# Patient Record
Sex: Male | Born: 1955 | Race: White | Hispanic: No | Smoking: Heavy tobacco smoker
Health system: Southern US, Community
[De-identification: ages and names within clinical notes are randomized; demographics above are authoritative.]

## PROBLEM LIST (undated history)

## (undated) DIAGNOSIS — G4730 Sleep apnea, unspecified: Secondary | ICD-10-CM

## (undated) DIAGNOSIS — E785 Hyperlipidemia, unspecified: Secondary | ICD-10-CM

## (undated) DIAGNOSIS — F32A Depression, unspecified: Secondary | ICD-10-CM

## (undated) DIAGNOSIS — I10 Essential (primary) hypertension: Secondary | ICD-10-CM

## (undated) DIAGNOSIS — F329 Major depressive disorder, single episode, unspecified: Secondary | ICD-10-CM

## (undated) DIAGNOSIS — J449 Chronic obstructive pulmonary disease, unspecified: Secondary | ICD-10-CM

## (undated) HISTORY — DX: Major depressive disorder, single episode, unspecified: F32.9

## (undated) HISTORY — DX: Sleep apnea, unspecified: G47.30

## (undated) HISTORY — DX: Essential (primary) hypertension: I10

## (undated) HISTORY — DX: Depression, unspecified: F32.A

## (undated) HISTORY — PX: OTHER SURGICAL HISTORY: SHX169

## (undated) HISTORY — PX: APPENDECTOMY: SHX54

## (undated) HISTORY — DX: Hyperlipidemia, unspecified: E78.5

## (undated) HISTORY — DX: Chronic obstructive pulmonary disease, unspecified: J44.9

---

## 2008-04-25 ENCOUNTER — Emergency Department (HOSPITAL_COMMUNITY): Admission: EM | Admit: 2008-04-25 | Discharge: 2008-04-25 | Payer: Self-pay | Admitting: Emergency Medicine

## 2010-04-17 ENCOUNTER — Emergency Department (HOSPITAL_COMMUNITY): Admission: EM | Admit: 2010-04-17 | Discharge: 2010-04-17 | Payer: Self-pay | Admitting: Family Medicine

## 2011-04-30 ENCOUNTER — Inpatient Hospital Stay (INDEPENDENT_AMBULATORY_CARE_PROVIDER_SITE_OTHER)
Admission: RE | Admit: 2011-04-30 | Discharge: 2011-04-30 | Disposition: A | Discharge status: Home / Self Care | Payer: BC Managed Care – PPO | Source: Ambulatory Visit | Attending: Emergency Medicine | Admitting: Emergency Medicine

## 2011-04-30 DIAGNOSIS — B353 Tinea pedis: Secondary | ICD-10-CM

## 2011-04-30 DIAGNOSIS — T7840XA Allergy, unspecified, initial encounter: Secondary | ICD-10-CM

## 2011-05-25 IMAGING — CR DG CHEST 2V
2 series · 2 of 2 positions shown · non-contrast
Comparison: None.

CLINICAL DATA: Cough

CHEST - 2 VIEW

[view not recorded (1 of 2)]
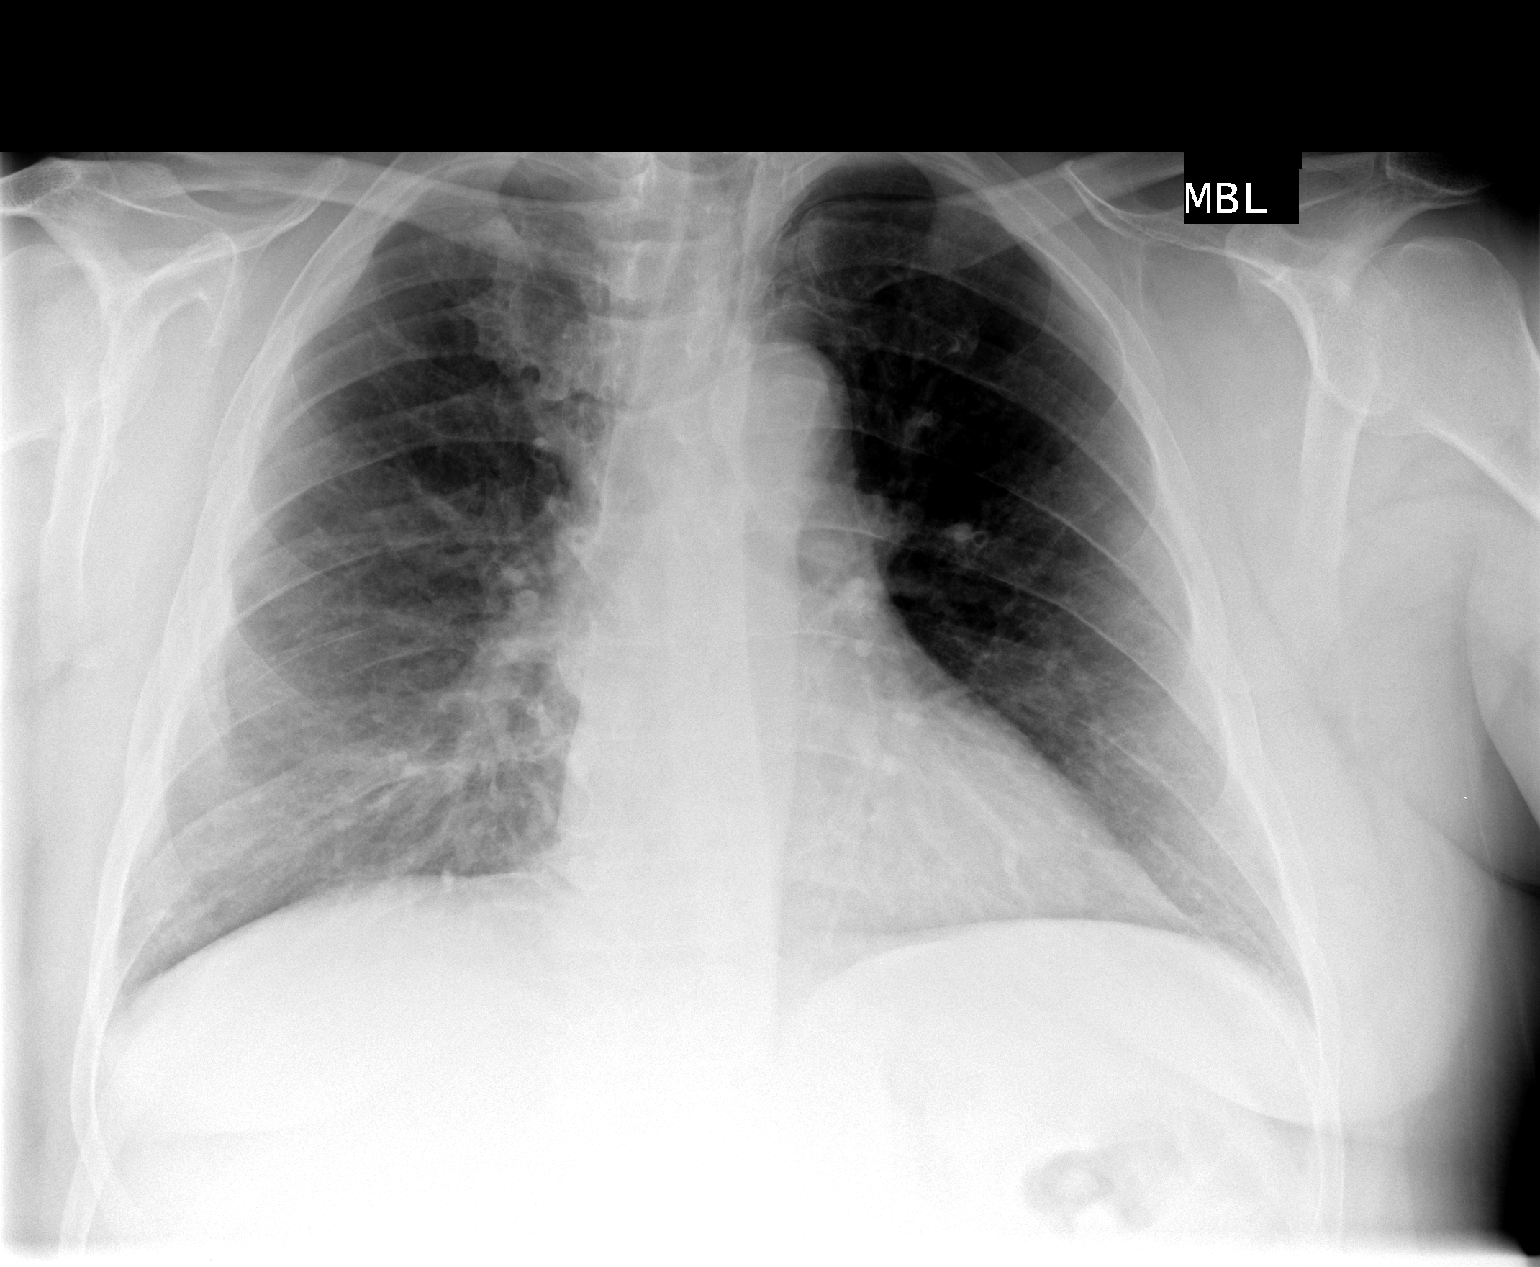

[view not recorded (2 of 2)]
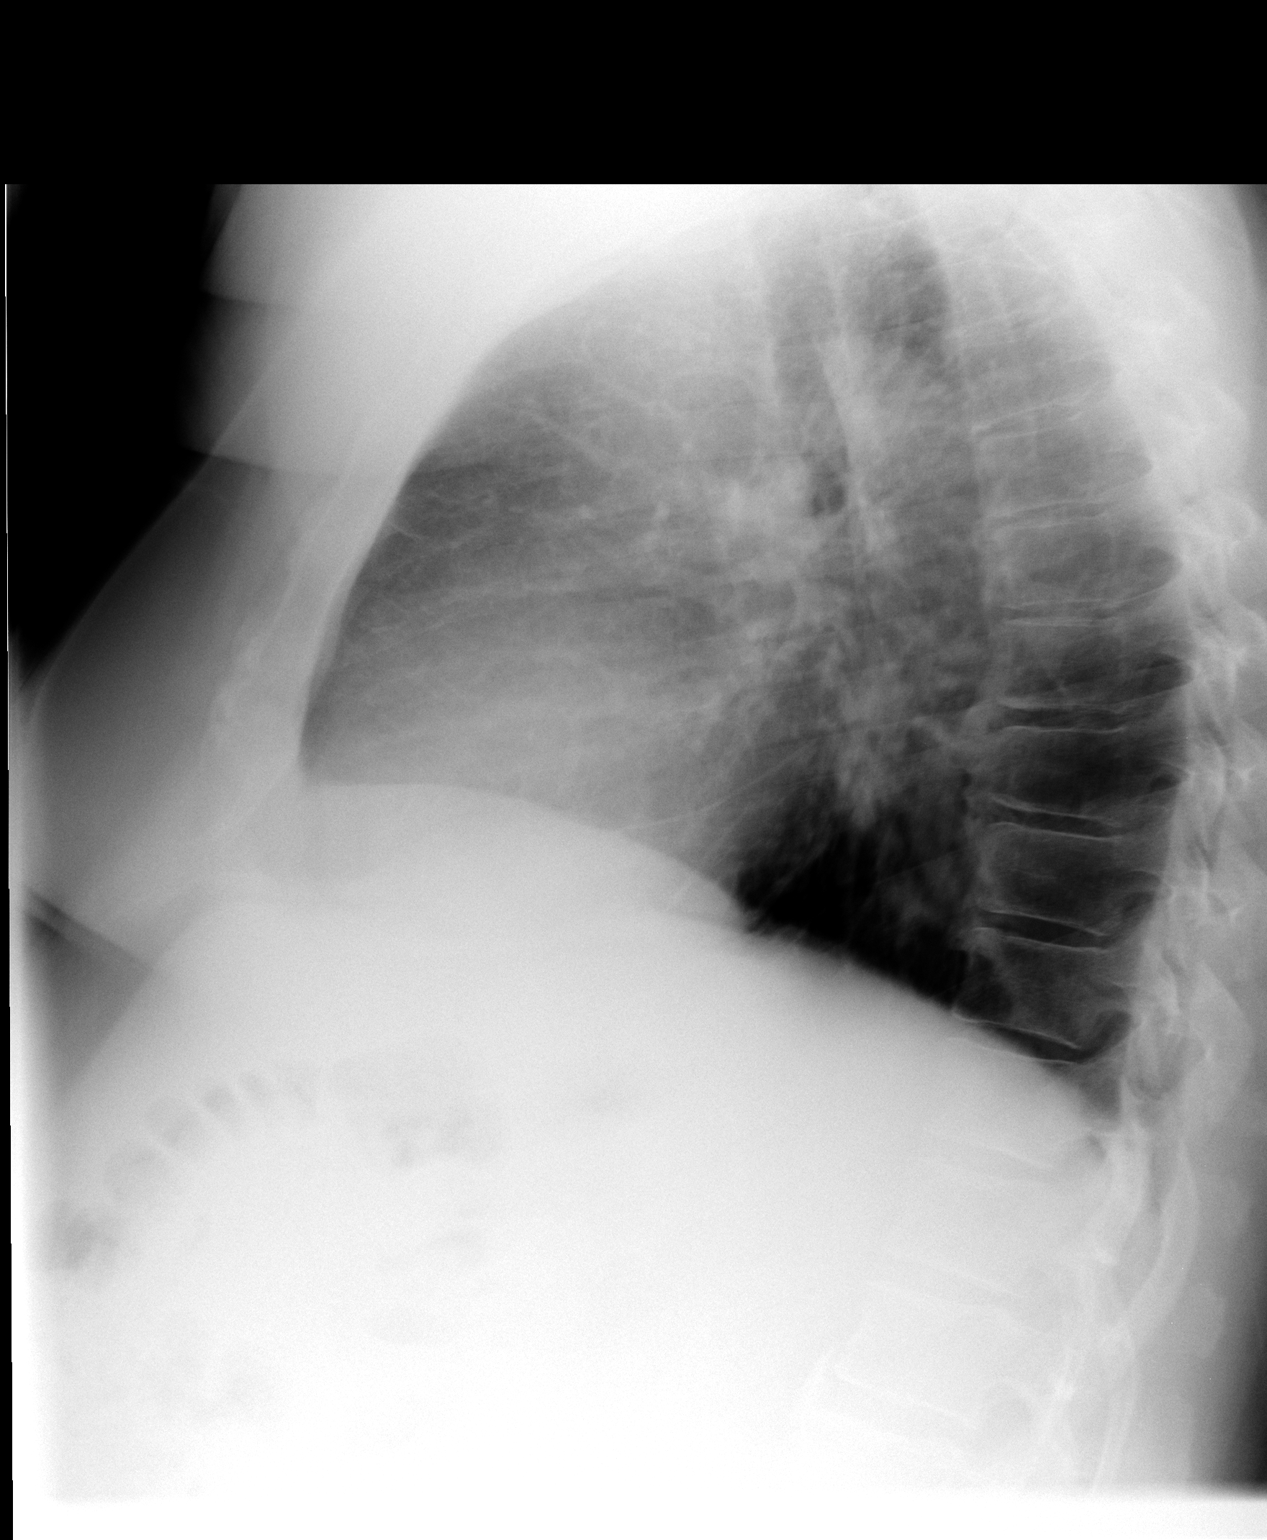

[2 of 2 positions shown; findings below may reference images not displayed]

FINDINGS: Cardiomediastinal silhouette is unremarkable.  No acute
infiltrate or pleural effusion.  No pulmonary edema.  Mild
degenerative changes are noted mid and lower thoracic spine.
IMPRESSION: No acute infiltrate or edema.  Mild degenerative changes thoracic
spine.

## 2014-02-04 ENCOUNTER — Ambulatory Visit: Payer: BC Managed Care – PPO | Attending: Internal Medicine | Admitting: Internal Medicine

## 2014-02-04 ENCOUNTER — Encounter: Payer: Self-pay | Admitting: Internal Medicine

## 2014-02-04 VITALS — BP 138/91 | HR 92 | Temp 97.7°F | Resp 17 | Wt 252.0 lb

## 2014-02-04 DIAGNOSIS — Z8669 Personal history of other diseases of the nervous system and sense organs: Secondary | ICD-10-CM

## 2014-02-04 DIAGNOSIS — J449 Chronic obstructive pulmonary disease, unspecified: Secondary | ICD-10-CM | POA: Insufficient documentation

## 2014-02-04 DIAGNOSIS — Z87898 Personal history of other specified conditions: Secondary | ICD-10-CM

## 2014-02-04 DIAGNOSIS — Z8709 Personal history of other diseases of the respiratory system: Secondary | ICD-10-CM

## 2014-02-04 DIAGNOSIS — J4489 Other specified chronic obstructive pulmonary disease: Secondary | ICD-10-CM | POA: Insufficient documentation

## 2014-02-04 DIAGNOSIS — Z8659 Personal history of other mental and behavioral disorders: Secondary | ICD-10-CM

## 2014-02-04 DIAGNOSIS — F329 Major depressive disorder, single episode, unspecified: Secondary | ICD-10-CM | POA: Insufficient documentation

## 2014-02-04 DIAGNOSIS — G4730 Sleep apnea, unspecified: Secondary | ICD-10-CM | POA: Insufficient documentation

## 2014-02-04 DIAGNOSIS — F3289 Other specified depressive episodes: Secondary | ICD-10-CM | POA: Insufficient documentation

## 2014-02-04 DIAGNOSIS — Z139 Encounter for screening, unspecified: Secondary | ICD-10-CM

## 2014-02-04 DIAGNOSIS — F17200 Nicotine dependence, unspecified, uncomplicated: Secondary | ICD-10-CM | POA: Insufficient documentation

## 2014-02-04 DIAGNOSIS — I10 Essential (primary) hypertension: Secondary | ICD-10-CM | POA: Insufficient documentation

## 2014-02-04 DIAGNOSIS — E785 Hyperlipidemia, unspecified: Secondary | ICD-10-CM | POA: Insufficient documentation

## 2014-02-04 LAB — COMPLETE METABOLIC PANEL WITH GFR
ALBUMIN: 4.4 g/dL (ref 3.5–5.2)
ALK PHOS: 65 U/L (ref 39–117)
ALT: 37 U/L (ref 0–53)
AST: 20 U/L (ref 0–37)
BILIRUBIN TOTAL: 0.5 mg/dL (ref 0.2–1.2)
BUN: 12 mg/dL (ref 6–23)
CALCIUM: 9.3 mg/dL (ref 8.4–10.5)
CHLORIDE: 107 meq/L (ref 96–112)
CO2: 26 mEq/L (ref 19–32)
CREATININE: 0.76 mg/dL (ref 0.50–1.35)
GFR, Est African American: >89 mL/min
GLUCOSE: 100 mg/dL — AB (ref 70–99)
Potassium: 4.7 mEq/L (ref 3.5–5.3)
Sodium: 142 mEq/L (ref 135–145)
Total Protein: 6.6 g/dL (ref 6.0–8.3)

## 2014-02-04 LAB — LIPID PANEL
Cholesterol: 190 mg/dL (ref 0–200)
HDL: 36 mg/dL — AB (ref >39)
LDL Cholesterol: 132 mg/dL — ABNORMAL HIGH (ref 0–99)
TRIGLYCERIDES: 110 mg/dL (ref <150)
Total CHOL/HDL Ratio: 5.3 Ratio
VLDL: 22 mg/dL (ref 0–40)

## 2014-02-04 LAB — CBC WITH DIFFERENTIAL/PLATELET
BASOS ABS: 0.1 10*3/uL (ref 0.0–0.1)
Basophils Relative: 1 % (ref 0–1)
EOS ABS: 0.2 10*3/uL (ref 0.0–0.7)
EOS PCT: 4 % (ref 0–5)
HEMATOCRIT: 45 % (ref 39.0–52.0)
HEMOGLOBIN: 15.5 g/dL (ref 13.0–17.0)
LYMPHS ABS: 2.7 10*3/uL (ref 0.7–4.0)
LYMPHS PCT: 50 % — AB (ref 12–46)
MCH: 32 pg (ref 26.0–34.0)
MCHC: 34.4 g/dL (ref 30.0–36.0)
MCV: 92.8 fL (ref 78.0–100.0)
Monocytes Absolute: 0.4 10*3/uL (ref 0.1–1.0)
Monocytes Relative: 8 % (ref 3–12)
Neutro Abs: 2 10*3/uL (ref 1.7–7.7)
Neutrophils Relative %: 37 % — ABNORMAL LOW (ref 43–77)
PLATELETS: 301 10*3/uL (ref 150–400)
RBC: 4.85 MIL/uL (ref 4.22–5.81)
RDW: 13.7 % (ref 11.5–15.5)
WBC: 5.4 10*3/uL (ref 4.0–10.5)

## 2014-02-04 LAB — VITAMIN D 25 HYDROXY (VIT D DEFICIENCY, FRACTURES): Vit D, 25-Hydroxy: 51 ng/mL (ref 30–89)

## 2014-02-04 LAB — TSH: TSH: 1.571 u[IU]/mL (ref 0.350–4.500)

## 2014-02-04 NOTE — Progress Notes (Signed)
Patient here to establish care History of HTN, COPD, high cholesterol Sleep apnea and depression

## 2014-02-04 NOTE — Progress Notes (Signed)
Patient Demographics  Keith Cordova, is a 58 y.o. male  WUJ:[id number]  NFA:[id number]  DOB - 12/22/1955  CC:  Chief Complaint  Patient presents with  . Establish Care       HPI: Keith Cordova is a 58 y.o. male here today to establish medical care. Patient has history of hypertension COPD depression sleep apnea hyperlipidemia, as per patient he used some blood pressure medication and cholesterol medication in the past and also took Zoloft and some other SSRI, for the past 2 years he has not been on any medications, denies any SI or HI, does not think he needs any medication for depression. Patient does smoke cigarettes, advised patient to quit smoking. Patient denies any orthopnea or PND or leg swelling. Patient has No headache, No chest pain, No abdominal pain - No Nausea, No new weakness tingling or numbness, No Cough - SOB.  Allergies  Allergen Reactions  . Morphine And Related    Past Medical History  Diagnosis Date  . Hyperlipidemia   . Hypertension   . COPD (chronic obstructive pulmonary disease)   . Sleep apnea   . Depression    No current outpatient prescriptions on file prior to visit.   No current facility-administered medications on file prior to visit.   Family History  Problem Relation Age of Onset  . Heart disease Father    History   Social History  . Marital Status: Divorced    Spouse Name: N/A    Number of Children: N/A  . Years of Education: N/A   Occupational History  . Not on file.   Social History Main Topics  . Smoking status: Heavy Tobacco Smoker -- 1.00 packs/day for 50 years  . Smokeless tobacco: Not on file  . Alcohol Use: Yes  . Drug Use: Not on file  . Sexual Activity: Not on file   Other Topics Concern  . Not on file   Social History Narrative  . No narrative on file    Review of Systems: Constitutional: Negative for fever, chills, diaphoresis, activity change, appetite change and fatigue. HENT: Negative for ear  pain, nosebleeds, congestion, facial swelling, rhinorrhea, neck pain, neck stiffness and ear discharge.  Eyes: Negative for pain, discharge, redness, itching and visual disturbance. Respiratory: Negative for cough, choking, chest tightness, shortness of breath, wheezing and stridor.  Cardiovascular: Negative for chest pain, palpitations and leg swelling. Gastrointestinal: Negative for abdominal distention. Genitourinary: Negative for dysuria, urgency, frequency, hematuria, flank pain, decreased urine volume, difficulty urinating and dyspareunia.  Musculoskeletal: Negative for back pain, joint swelling, arthralgia and gait problem. Neurological: Negative for dizziness, tremors, seizures, syncope, facial asymmetry, speech difficulty, weakness, light-headedness, numbness and headaches.  Hematological: Negative for adenopathy. Does not bruise/bleed easily. Psychiatric/Behavioral: Negative for hallucinations, behavioral problems, confusion, dysphoric mood, decreased concentration and agitation.    Objective:   Filed Vitals:   02/04/14 0911  BP: 138/91  Pulse: 92  Temp: 97.7 F (36.5 C)  Resp: 17    Physical Exam: Constitutional: Obese male sitting comfortably not in acute distress HENT: Normocephalic, atraumatic, External right and left ear normal. Oropharynx is clear and moist.  Eyes: Conjunctivae and EOM are normal. PERRLA, no scleral icterus. Neck: Normal ROM. Neck supple. No JVD. No tracheal deviation. No thyromegaly. CVS: RRR, S1/S2 +, no murmurs, no gallops, no carotid bruit.  Pulmonary: Effort and breath sounds normal, no stridor, rhonchi, wheezes, rales.  Abdominal: Soft. BS +, no distension, tenderness, rebound or guarding.  Musculoskeletal: Normal range of  motion. No edema and no tenderness.  Neuro: Alert. Normal reflexes, muscle tone coordination. No cranial nerve deficit. Skin: Skin is warm and dry. No rash noted. Not diaphoretic. No erythema. No pallor. Psychiatric: Normal  mood and affect. Behavior, judgment, thought content normal.  No results found for this basename: WBC, HGB, HCT, MCV, PLT   No results found for this basename: CREATININE, BUN, NA, K, CL, CO2    No results found for this basename: HGBA1C   Lipid Panel  No results found for this basename: chol, trig, hdl, cholhdl, vldl, ldlcalc       Assessment and plan:   1. Essential hypertension, benign Blood pressure today is 138/91 patient is modifying diet, I have advised patient for DASH diet. Will check blood chemistry.  - COMPLETE METABOLIC PANEL WITH GFR  2. Other and unspecified hyperlipidemia Patient was on some medication in the past will recheck lipid panel. - Lipid panel  3. Smoking Advised patient to quit smoking  4. History of depression Patient denies any symptoms currently.  5. History of COPD/  History of sleep apnea ? Patient was told he has COPD, patient was not on any medications. Advised patient to quit smoking. Patient is not ready yet.  7. Screening Ordered baseline blood work  - CBC with Differential - TSH - Vit D  25 hydroxy (rtn osteoporosis monitoring)        Health Maintenance -Colonoscopy: As per patient 2 years ago he had a colonoscopy done.  Return in about 3 months (around 05/06/2014) for hypertension, hyperipidemia.   Doris Cheadle, MD

## 2014-02-04 NOTE — Patient Instructions (Signed)
DASH Diet  The DASH diet stands for "Dietary Approaches to Stop Hypertension." It is a healthy eating plan that has been shown to reduce high blood pressure (hypertension) in as little as 14 days, while also possibly providing other significant health benefits. These other health benefits include reducing the risk of breast cancer after menopause and reducing the risk of type 2 diabetes, heart disease, colon cancer, and stroke. Health benefits also include weight loss and slowing kidney failure in patients with chronic kidney disease.   DIET GUIDELINES  · Limit salt (sodium). Your diet should contain less than 1500 mg of sodium daily.  · Limit refined or processed carbohydrates. Your diet should include mostly whole grains. Desserts and added sugars should be used sparingly.  · Include small amounts of heart-healthy fats. These types of fats include nuts, oils, and tub margarine. Limit saturated and trans fats. These fats have been shown to be harmful in the body.  CHOOSING FOODS   The following food groups are based on a 2000 calorie diet. See your Registered Dietitian for individual calorie needs.  Grains and Grain Products (6 to 8 servings daily)  · Eat More Often: Whole-wheat bread, brown rice, whole-grain or wheat pasta, quinoa, popcorn without added fat or salt (air popped).  · Eat Less Often: White bread, white pasta, white rice, cornbread.  Vegetables (4 to 5 servings daily)  · Eat More Often: Fresh, frozen, and canned vegetables. Vegetables may be raw, steamed, roasted, or grilled with a minimal amount of fat.  · Eat Less Often/Avoid: Creamed or fried vegetables. Vegetables in a cheese sauce.  Fruit (4 to 5 servings daily)  · Eat More Often: All fresh, canned (in natural juice), or frozen fruits. Dried fruits without added sugar. One hundred percent fruit juice (½ cup [237 mL] daily).  · Eat Less Often: Dried fruits with added sugar. Canned fruit in light or heavy syrup.  Lean Meats, Fish, and Poultry (2  servings or less daily. One serving is 3 to 4 oz [85-114 g]).  · Eat More Often: Ninety percent or leaner ground beef, tenderloin, sirloin. Round cuts of beef, chicken breast, turkey breast. All fish. Grill, bake, or broil your meat. Nothing should be fried.  · Eat Less Often/Avoid: Fatty cuts of meat, turkey, or chicken leg, thigh, or wing. Fried cuts of meat or fish.  Dairy (2 to 3 servings)  · Eat More Often: Low-fat or fat-free milk, low-fat plain or light yogurt, reduced-fat or part-skim cheese.  · Eat Less Often/Avoid: Milk (whole, 2%). Whole milk yogurt. Full-fat cheeses.  Nuts, Seeds, and Legumes (4 to 5 servings per week)  · Eat More Often: All without added salt.  · Eat Less Often/Avoid: Salted nuts and seeds, canned beans with added salt.  Fats and Sweets (limited)  · Eat More Often: Vegetable oils, tub margarines without trans fats, sugar-free gelatin. Mayonnaise and salad dressings.  · Eat Less Often/Avoid: Coconut oils, palm oils, butter, stick margarine, cream, half and half, cookies, candy, pie.  FOR MORE INFORMATION  The Dash Diet Eating Plan: www.dashdiet.org  Document Released: 10/13/2011 Document Revised: 01/16/2012 Document Reviewed: 10/13/2011  [employer]® Patient Information ©2014 [employer].

## 2014-02-06 ENCOUNTER — Telehealth: Payer: Self-pay

## 2014-02-06 NOTE — Telephone Encounter (Signed)
Message copied by Lestine MountJUAREZ, Adib Wahba L on Thu Feb 06, 2014  4:34 PM ------      Message from: Doris CheadleADVANI, DEEPAK      Created: Wed Feb 05, 2014 11:51 AM       Blood work reviewed, noticed elevated LDL cholesterol and also borderline high blood glucose, advise patient for low fat and low carb diet.             ------

## 2014-02-06 NOTE — Telephone Encounter (Signed)
Patient not available Left message to return our call 

## 2014-02-07 ENCOUNTER — Ambulatory Visit: Payer: BC Managed Care – PPO | Attending: Internal Medicine

## 2014-02-07 ENCOUNTER — Telehealth: Payer: Self-pay | Admitting: Internal Medicine

## 2014-02-07 NOTE — Telephone Encounter (Signed)
Pt. Would like lab results..pt. Has given permission to call his ex-wife. Thank you.

## 2014-02-11 NOTE — Telephone Encounter (Signed)
Patient not available Left message on voice mail to return our call 

## 2014-05-06 ENCOUNTER — Ambulatory Visit: Payer: BC Managed Care – PPO | Admitting: Internal Medicine

## 2019-03-25 DIAGNOSIS — G4733 Obstructive sleep apnea (adult) (pediatric): Secondary | ICD-10-CM | POA: Diagnosis not present

## 2019-03-25 DIAGNOSIS — Z79899 Other long term (current) drug therapy: Secondary | ICD-10-CM | POA: Diagnosis not present

## 2019-03-25 DIAGNOSIS — E782 Mixed hyperlipidemia: Secondary | ICD-10-CM | POA: Diagnosis not present

## 2019-03-25 DIAGNOSIS — I10 Essential (primary) hypertension: Secondary | ICD-10-CM | POA: Diagnosis not present

## 2020-03-04 DIAGNOSIS — I10 Essential (primary) hypertension: Secondary | ICD-10-CM | POA: Diagnosis not present

## 2020-03-04 DIAGNOSIS — Z720 Tobacco use: Secondary | ICD-10-CM | POA: Diagnosis not present

## 2020-03-04 DIAGNOSIS — G4700 Insomnia, unspecified: Secondary | ICD-10-CM | POA: Diagnosis not present

## 2020-04-27 DIAGNOSIS — E782 Mixed hyperlipidemia: Secondary | ICD-10-CM | POA: Diagnosis not present

## 2020-04-27 DIAGNOSIS — Z79899 Other long term (current) drug therapy: Secondary | ICD-10-CM | POA: Diagnosis not present

## 2020-04-27 DIAGNOSIS — R7301 Impaired fasting glucose: Secondary | ICD-10-CM | POA: Diagnosis not present

## 2020-04-27 DIAGNOSIS — I10 Essential (primary) hypertension: Secondary | ICD-10-CM | POA: Diagnosis not present

## 2020-04-27 DIAGNOSIS — R0602 Shortness of breath: Secondary | ICD-10-CM | POA: Diagnosis not present

## 2020-04-30 DIAGNOSIS — R0602 Shortness of breath: Secondary | ICD-10-CM | POA: Diagnosis not present

## 2021-03-19 DIAGNOSIS — R7301 Impaired fasting glucose: Secondary | ICD-10-CM | POA: Diagnosis not present

## 2021-03-19 DIAGNOSIS — R42 Dizziness and giddiness: Secondary | ICD-10-CM | POA: Diagnosis not present

## 2021-04-02 ENCOUNTER — Other Ambulatory Visit (HOSPITAL_COMMUNITY): Payer: Self-pay

## 2021-04-02 DIAGNOSIS — E782 Mixed hyperlipidemia: Secondary | ICD-10-CM | POA: Diagnosis not present

## 2021-04-02 DIAGNOSIS — I10 Essential (primary) hypertension: Secondary | ICD-10-CM | POA: Diagnosis not present

## 2021-04-02 MED ORDER — CITALOPRAM HYDROBROMIDE 20 MG PO TABS
20.0000 mg | ORAL_TABLET | ORAL | 4 refills | Status: DC
  Filled 2021-04-02: qty 90, 90d supply, fill #0
  Filled 2021-07-16: qty 90, 90d supply, fill #1
  Filled 2021-10-19: qty 90, 90d supply, fill #2
  Filled 2022-01-20: qty 90, 90d supply, fill #3

## 2021-04-02 MED ORDER — BISOPROLOL-HYDROCHLOROTHIAZIDE 2.5-6.25 MG PO TABS
1.0000 | ORAL_TABLET | Freq: Every morning | ORAL | 3 refills | Status: DC
  Filled 2021-04-02: qty 90, 90d supply, fill #0
  Filled 2021-07-16: qty 90, 90d supply, fill #1
  Filled 2021-10-19: qty 90, 90d supply, fill #2
  Filled 2022-01-20: qty 90, 90d supply, fill #3

## 2021-04-02 MED ORDER — ATORVASTATIN CALCIUM 20 MG PO TABS
20.0000 mg | ORAL_TABLET | Freq: Every day | ORAL | 3 refills | Status: DC
  Filled 2021-04-02: qty 90, 90d supply, fill #0
  Filled 2021-07-16: qty 90, 90d supply, fill #1
  Filled 2021-10-19: qty 90, 90d supply, fill #2
  Filled 2022-01-20: qty 90, 90d supply, fill #3

## 2021-04-06 ENCOUNTER — Other Ambulatory Visit (HOSPITAL_COMMUNITY): Payer: Self-pay

## 2021-05-08 DIAGNOSIS — R051 Acute cough: Secondary | ICD-10-CM | POA: Diagnosis not present

## 2021-05-08 DIAGNOSIS — R0600 Dyspnea, unspecified: Secondary | ICD-10-CM | POA: Diagnosis not present

## 2021-05-08 DIAGNOSIS — R531 Weakness: Secondary | ICD-10-CM | POA: Diagnosis not present

## 2021-07-06 ENCOUNTER — Other Ambulatory Visit (HOSPITAL_COMMUNITY): Payer: Self-pay

## 2021-07-07 ENCOUNTER — Other Ambulatory Visit (HOSPITAL_COMMUNITY): Payer: Self-pay

## 2021-07-08 ENCOUNTER — Other Ambulatory Visit (HOSPITAL_COMMUNITY): Payer: Self-pay

## 2021-07-09 ENCOUNTER — Other Ambulatory Visit (HOSPITAL_COMMUNITY): Payer: Self-pay

## 2021-07-16 ENCOUNTER — Other Ambulatory Visit (HOSPITAL_COMMUNITY): Payer: Self-pay

## 2021-07-19 ENCOUNTER — Other Ambulatory Visit (HOSPITAL_COMMUNITY): Payer: Self-pay

## 2021-10-19 ENCOUNTER — Other Ambulatory Visit (HOSPITAL_COMMUNITY): Payer: Self-pay

## 2021-10-26 ENCOUNTER — Other Ambulatory Visit (HOSPITAL_COMMUNITY): Payer: Self-pay

## 2021-10-26 DIAGNOSIS — Z23 Encounter for immunization: Secondary | ICD-10-CM | POA: Diagnosis not present

## 2021-10-26 DIAGNOSIS — Z0000 Encounter for general adult medical examination without abnormal findings: Secondary | ICD-10-CM | POA: Diagnosis not present

## 2021-10-26 DIAGNOSIS — I10 Essential (primary) hypertension: Secondary | ICD-10-CM | POA: Diagnosis not present

## 2021-10-26 DIAGNOSIS — Z79899 Other long term (current) drug therapy: Secondary | ICD-10-CM | POA: Diagnosis not present

## 2021-10-26 DIAGNOSIS — E782 Mixed hyperlipidemia: Secondary | ICD-10-CM | POA: Diagnosis not present

## 2021-10-26 DIAGNOSIS — D7589 Other specified diseases of blood and blood-forming organs: Secondary | ICD-10-CM | POA: Diagnosis not present

## 2021-10-26 MED ORDER — ATORVASTATIN CALCIUM 20 MG PO TABS
20.0000 mg | ORAL_TABLET | Freq: Every day | ORAL | 3 refills | Status: AC
  Filled 2021-10-26 – 2022-05-05 (×2): qty 90, 90d supply, fill #0
  Filled 2022-08-12: qty 90, 90d supply, fill #1

## 2021-10-26 MED ORDER — CITALOPRAM HYDROBROMIDE 20 MG PO TABS
20.0000 mg | ORAL_TABLET | Freq: Every morning | ORAL | 4 refills | Status: AC
  Filled 2021-10-26 – 2022-05-05 (×2): qty 90, 90d supply, fill #0
  Filled 2022-08-12: qty 90, 90d supply, fill #1

## 2021-10-26 MED ORDER — BISOPROLOL-HYDROCHLOROTHIAZIDE 2.5-6.25 MG PO TABS
1.0000 | ORAL_TABLET | Freq: Every morning | ORAL | 4 refills | Status: AC
  Filled 2021-10-26 – 2022-05-05 (×2): qty 90, 90d supply, fill #0
  Filled 2022-08-12: qty 90, 90d supply, fill #1

## 2021-10-27 ENCOUNTER — Other Ambulatory Visit (HOSPITAL_COMMUNITY): Payer: Self-pay

## 2021-10-27 MED ORDER — B-12 1000 MCG SL SUBL: 1.0000 | SUBLINGUAL_TABLET | Freq: Every day | SUBLINGUAL | 3 refills | Status: AC

## 2021-10-28 ENCOUNTER — Other Ambulatory Visit (HOSPITAL_COMMUNITY): Payer: Self-pay

## 2022-01-20 ENCOUNTER — Other Ambulatory Visit (HOSPITAL_COMMUNITY): Payer: Self-pay

## 2022-03-28 ENCOUNTER — Other Ambulatory Visit (HOSPITAL_COMMUNITY): Payer: Self-pay

## 2022-03-28 DIAGNOSIS — L02439 Carbuncle of limb, unspecified: Secondary | ICD-10-CM | POA: Diagnosis not present

## 2022-03-28 DIAGNOSIS — Z8619 Personal history of other infectious and parasitic diseases: Secondary | ICD-10-CM | POA: Diagnosis not present

## 2022-03-28 MED ORDER — MUPIROCIN 2 % EX OINT
1.0000 "application " | TOPICAL_OINTMENT | Freq: Two times a day (BID) | CUTANEOUS | 1 refills | Status: AC | PRN
  Filled 2022-03-28: qty 22, 7d supply, fill #0
  Filled 2022-04-07: qty 22, 10d supply, fill #0

## 2022-03-28 MED ORDER — SULFAMETHOXAZOLE-TRIMETHOPRIM 800-160 MG PO TABS
1.0000 | ORAL_TABLET | Freq: Two times a day (BID) | ORAL | 0 refills | Status: AC
  Filled 2022-03-28 – 2022-04-07 (×2): qty 14, 7d supply, fill #0

## 2022-04-06 ENCOUNTER — Other Ambulatory Visit (HOSPITAL_COMMUNITY): Payer: Self-pay

## 2022-04-07 ENCOUNTER — Other Ambulatory Visit (HOSPITAL_COMMUNITY): Payer: Self-pay

## 2022-05-05 ENCOUNTER — Other Ambulatory Visit (HOSPITAL_COMMUNITY): Payer: Self-pay

## 2022-08-12 ENCOUNTER — Other Ambulatory Visit (HOSPITAL_COMMUNITY): Payer: Self-pay

## 2022-10-12 ENCOUNTER — Telehealth: Payer: Self-pay

## 2022-10-12 NOTE — Patient Outreach (Signed)
  Care Coordination   10/12/2022 Name: Keith Cordova MRN: [id number] DOB: 03-29-1956   Care Coordination Outreach Attempts:  An unsuccessful telephone outreach was attempted today to offer the patient information about available care coordination services as a benefit of their health plan.   Follow Up Plan:  Additional outreach attempts will be made to offer the patient care coordination information and services.   Encounter Outcome:  No Answer   Care Coordination Interventions:  No, not indicated    Rowe Pavy, RN, BSN, [facility] [employer] [phone]

## 2022-10-14 ENCOUNTER — Telehealth: Payer: Self-pay

## 2022-10-14 NOTE — Patient Outreach (Signed)
  Care Coordination   10/14/2022 Name: Keith Cordova MRN: [id number] DOB: 1956/08/13   Care Coordination Outreach Attempts:  A second unsuccessful outreach was attempted today to offer the patient with information about available care coordination services as a benefit of their health plan.     Follow Up Plan:  Additional outreach attempts will be made to offer the patient care coordination information and services.   Encounter Outcome:  No Answer   Care Coordination Interventions:  No, not indicated    Rowe Pavy, RN, BSN, [facility] [employer] [phone]

## 2022-10-22 ENCOUNTER — Emergency Department (HOSPITAL_COMMUNITY)
Admission: EM | Admit: 2022-10-22 | Discharge: 2022-10-22 | Disposition: A | Discharge status: Home / Self Care | Payer: Medicare Other | Attending: Emergency Medicine | Admitting: Emergency Medicine

## 2022-10-22 ENCOUNTER — Ambulatory Visit
Admission: EM | Admit: 2022-10-22 | Discharge: 2022-10-22 | Disposition: A | Discharge status: Home / Self Care | Payer: Medicare Other | Attending: Internal Medicine | Admitting: Internal Medicine

## 2022-10-22 ENCOUNTER — Encounter (HOSPITAL_COMMUNITY): Payer: Self-pay

## 2022-10-22 ENCOUNTER — Other Ambulatory Visit: Payer: Self-pay

## 2022-10-22 ENCOUNTER — Emergency Department (HOSPITAL_COMMUNITY): Payer: Medicare Other

## 2022-10-22 DIAGNOSIS — R0602 Shortness of breath: Secondary | ICD-10-CM | POA: Insufficient documentation

## 2022-10-22 DIAGNOSIS — J441 Chronic obstructive pulmonary disease with (acute) exacerbation: Secondary | ICD-10-CM

## 2022-10-22 DIAGNOSIS — J40 Bronchitis, not specified as acute or chronic: Secondary | ICD-10-CM | POA: Diagnosis not present

## 2022-10-22 DIAGNOSIS — Z1152 Encounter for screening for COVID-19: Secondary | ICD-10-CM | POA: Insufficient documentation

## 2022-10-22 DIAGNOSIS — J449 Chronic obstructive pulmonary disease, unspecified: Secondary | ICD-10-CM | POA: Insufficient documentation

## 2022-10-22 DIAGNOSIS — R7981 Abnormal blood-gas level: Secondary | ICD-10-CM | POA: Diagnosis not present

## 2022-10-22 DIAGNOSIS — R0603 Acute respiratory distress: Secondary | ICD-10-CM | POA: Insufficient documentation

## 2022-10-22 DIAGNOSIS — R0682 Tachypnea, not elsewhere classified: Secondary | ICD-10-CM | POA: Diagnosis not present

## 2022-10-22 DIAGNOSIS — R059 Cough, unspecified: Secondary | ICD-10-CM | POA: Diagnosis not present

## 2022-10-22 DIAGNOSIS — J111 Influenza due to unidentified influenza virus with other respiratory manifestations: Secondary | ICD-10-CM

## 2022-10-22 DIAGNOSIS — R509 Fever, unspecified: Secondary | ICD-10-CM | POA: Diagnosis not present

## 2022-10-22 LAB — CBC WITH DIFFERENTIAL/PLATELET
Abs Immature Granulocytes: 0.03 10*3/uL (ref 0.00–0.07)
Basophils Absolute: 0 10*3/uL (ref 0.0–0.1)
Basophils Relative: 1 %
Eosinophils Absolute: 0 10*3/uL (ref 0.0–0.5)
Eosinophils Relative: 0 %
HCT: 41.8 % (ref 39.0–52.0)
Hemoglobin: 14.2 g/dL (ref 13.0–17.0)
Immature Granulocytes: 1 %
Lymphocytes Relative: 7 %
Lymphs Abs: 0.4 10*3/uL — ABNORMAL LOW (ref 0.7–4.0)
MCH: 33.4 pg (ref 26.0–34.0)
MCHC: 34 g/dL (ref 30.0–36.0)
MCV: 98.4 fL (ref 80.0–100.0)
Monocytes Absolute: 0.7 10*3/uL (ref 0.1–1.0)
Monocytes Relative: 12 %
Neutro Abs: 5 10*3/uL (ref 1.7–7.7)
Neutrophils Relative %: 79 %
Platelets: 236 10*3/uL (ref 150–400)
RBC: 4.25 MIL/uL (ref 4.22–5.81)
RDW: 12.8 % (ref 11.5–15.5)
WBC: 6.2 10*3/uL (ref 4.0–10.5)
nRBC: 0 % (ref 0.0–0.2)

## 2022-10-22 LAB — COMPREHENSIVE METABOLIC PANEL
ALT: 33 U/L (ref 0–44)
AST: 40 U/L (ref 15–41)
Albumin: 4 g/dL (ref 3.5–5.0)
Alkaline Phosphatase: 60 U/L (ref 38–126)
Anion gap: 13 (ref 5–15)
BUN: 16 mg/dL (ref 8–23)
CO2: 21 mmol/L — ABNORMAL LOW (ref 22–32)
Calcium: 9 mg/dL (ref 8.9–10.3)
Chloride: 105 mmol/L (ref 98–111)
Creatinine, Ser: 1.19 mg/dL (ref 0.61–1.24)
GFR, Estimated: >60 mL/min (ref >60)
Glucose, Bld: 114 mg/dL — ABNORMAL HIGH (ref 70–99)
Potassium: 3.7 mmol/L (ref 3.5–5.1)
Sodium: 139 mmol/L (ref 135–145)
Total Bilirubin: 0.7 mg/dL (ref 0.3–1.2)
Total Protein: 6.5 g/dL (ref 6.5–8.1)

## 2022-10-22 LAB — LACTIC ACID, PLASMA: Lactic Acid, Venous: 2.8 mmol/L (ref 0.5–1.9)

## 2022-10-22 LAB — PROTIME-INR
INR: 1 (ref 0.8–1.2)
Prothrombin Time: 13.5 seconds (ref 11.4–15.2)

## 2022-10-22 LAB — RESP PANEL BY RT-PCR (RSV, FLU A&B, COVID)  RVPGX2
Influenza A by PCR: POSITIVE — AB
Influenza B by PCR: NEGATIVE
Resp Syncytial Virus by PCR: NEGATIVE
SARS Coronavirus 2 by RT PCR: NEGATIVE

## 2022-10-22 MED ORDER — IPRATROPIUM BROMIDE 0.02 % IN SOLN
0.5000 mg | Freq: Once | RESPIRATORY_TRACT | Status: AC
  Administered 2022-10-22: 0.5 mg via RESPIRATORY_TRACT
  Filled 2022-10-22: qty 2.5

## 2022-10-22 MED ORDER — ALBUTEROL SULFATE HFA 108 (90 BASE) MCG/ACT IN AERS
2.0000 | INHALATION_SPRAY | RESPIRATORY_TRACT | Status: DC
  Administered 2022-10-22: 2 via RESPIRATORY_TRACT
  Filled 2022-10-22: qty 6.7

## 2022-10-22 MED ORDER — PREDNISONE 10 MG (21) PO TBPK: ORAL_TABLET | Freq: Every day | ORAL | 0 refills | Status: DC

## 2022-10-22 MED ORDER — IPRATROPIUM-ALBUTEROL 0.5-2.5 (3) MG/3ML IN SOLN
3.0000 mL | Freq: Once | RESPIRATORY_TRACT | Status: AC
  Administered 2022-10-22: 3 mL via RESPIRATORY_TRACT

## 2022-10-22 MED ORDER — ALBUTEROL SULFATE (2.5 MG/3ML) 0.083% IN NEBU
10.0000 mg/h | INHALATION_SOLUTION | Freq: Once | RESPIRATORY_TRACT | Status: AC
  Administered 2022-10-22: 10 mg/h via RESPIRATORY_TRACT
  Filled 2022-10-22: qty 3

## 2022-10-22 MED ORDER — METHYLPREDNISOLONE SODIUM SUCC 125 MG IJ SOLR
125.0000 mg | Freq: Once | INTRAMUSCULAR | Status: AC
  Administered 2022-10-22: 125 mg via INTRAVENOUS
  Filled 2022-10-22: qty 2

## 2022-10-22 MED ORDER — ACETAMINOPHEN 325 MG PO TABS
650.0000 mg | ORAL_TABLET | Freq: Once | ORAL | Status: AC
  Administered 2022-10-22: 650 mg via ORAL
  Filled 2022-10-22: qty 2

## 2022-10-22 MED ORDER — IPRATROPIUM-ALBUTEROL 0.5-2.5 (3) MG/3ML IN SOLN
3.0000 mL | Freq: Once | RESPIRATORY_TRACT | Status: AC
  Administered 2022-10-22: 3 mL via RESPIRATORY_TRACT
  Filled 2022-10-22: qty 3

## 2022-10-22 NOTE — ED Triage Notes (Signed)
Pt arrived POV w/ c/o shob, back pain, headache and eye pain. Onset of s/s yesterday afternoon. Pt is labored breathing in triage.

## 2022-10-22 NOTE — ED Provider Notes (Signed)
[facility] EMERGENCY DEPARTMENT Provider Note   CSN: [id number] Arrival date & time: 10/22/22  1643     History  Chief Complaint  Patient presents with   Shortness of Breath    Keith Cordova is a 66 y.o. male.  66 year old male presents with 24 hours of cough congestion which began suddenly last night.  History of COPD.  Does not have any home nebulizers.  Does not use O2 regularly.  Has had diffuse myalgias as well as weakness.  Emesis x 2 with abdominal discomfort.  No diarrhea noted.  Positive sick exposures.  Patient went to urgent care and sent here for further evaluation       Home Medications Prior to Admission medications   Medication Sig Start Date End Date Taking? Authorizing Provider  atorvastatin (LIPITOR) 20 MG tablet Take 1 tablet (20 mg total) by mouth daily for cholesterol 04/02/21     atorvastatin (LIPITOR) 20 MG tablet Take 1 tablet (20 mg total) by mouth daily for cholesterol 10/26/21     bisoprolol-hydrochlorothiazide (ZIAC) 2.5-6.25 MG tablet Take 1 tablet by mouth every morning for blood pressure 04/02/21     bisoprolol-hydrochlorothiazide (ZIAC) 2.5-6.25 MG tablet Take 1 tablet by mouth in the morning for blood pressure 10/26/21     citalopram (CELEXA) 20 MG tablet Take 1 tablet (20 mg total) by mouth every morning for depression/anxiety 04/02/21     citalopram (CELEXA) 20 MG tablet Take 1 tablet (20 mg total) by mouth in the morning for depression/anxiety 10/26/21     Cyanocobalamin (B-12) 1000 MCG SUBL Place 1 tablet under the tongue daily. 10/27/21     mupirocin ointment (BACTROBAN) 2 % Apply 1 application topically 2 (two) times daily as needed for skin infections. 03/28/22         Allergies    Morphine    Review of Systems   Review of Systems  All other systems reviewed and are negative.   Physical Exam Updated Vital Signs BP 135/70   Pulse (!) 109   Temp 98.7 F (37.1 C) (Oral)   Resp (!) 22   Ht 1.778 m (5\' 10" )   Wt  111.1 kg   SpO2 97%   BMI 35.15 kg/m  Physical Exam Vitals and nursing note reviewed.  Constitutional:      General: He is not in acute distress.    Appearance: Normal appearance. He is well-developed. He is not toxic-appearing.  HENT:     Head: Normocephalic and atraumatic.  Eyes:     General: Lids are normal.     Conjunctiva/sclera: Conjunctivae normal.     Pupils: Pupils are equal, round, and reactive to light.  Neck:     Thyroid: No thyroid mass.     Trachea: No tracheal deviation.  Cardiovascular:     Rate and Rhythm: Normal rate and regular rhythm.     Heart sounds: Normal heart sounds. No murmur heard.    No gallop.  Pulmonary:     Effort: Prolonged expiration and respiratory distress present.     Breath sounds: No stridor. Examination of the right-upper field reveals decreased breath sounds. Examination of the left-upper field reveals decreased breath sounds. Decreased breath sounds present. No wheezing, rhonchi or rales.  Abdominal:     General: There is no distension.     Palpations: Abdomen is soft.     Tenderness: There is no abdominal tenderness. There is no rebound.  Musculoskeletal:        General: No tenderness.  Normal range of motion.     Cervical back: Normal range of motion and neck supple.  Skin:    General: Skin is warm and dry.     Findings: No abrasion or rash.  Neurological:     Mental Status: He is alert and oriented to person, place, and time. Mental status is at baseline.     GCS: GCS eye subscore is 4. GCS verbal subscore is 5. GCS motor subscore is 6.     Cranial Nerves: No cranial nerve deficit.     Sensory: No sensory deficit.     Motor: Motor function is intact.  Psychiatric:        Attention and Perception: Attention normal.        Speech: Speech normal.        Behavior: Behavior normal.     ED Results / Procedures / Treatments   Labs (all labs ordered are listed, but only abnormal results are displayed) Labs Reviewed  CULTURE,  BLOOD (ROUTINE X 2)  CULTURE, BLOOD (ROUTINE X 2)  RESP PANEL BY RT-PCR (RSV, FLU A&B, COVID)  [id number]  COMPREHENSIVE METABOLIC PANEL  LACTIC ACID, PLASMA  LACTIC ACID, PLASMA  CBC WITH DIFFERENTIAL/PLATELET  PROTIME-INR  URINALYSIS, ROUTINE W REFLEX MICROSCOPIC    EKG EKG Interpretation  Date/Time:  Saturday October 22 2022 17:29:08 EST Ventricular Rate:  106 PR Interval:  144 QRS Duration: 74 QT Interval:  364 QTC Calculation: 483 R Axis:   54 Text Interpretation: Sinus tachycardia Nonspecific T wave abnormality Abnormal ECG No previous ECGs available Confirmed by Lorre Nick ([id number]) on 10/22/2022 6:10:04 PM  Radiology No results found.  Procedures Procedures    Medications Ordered in ED Medications  ipratropium-albuterol (DUONEB) 0.5-2.5 (3) MG/3ML nebulizer solution 3 mL (has no administration in time range)  acetaminophen (TYLENOL) tablet 650 mg (has no administration in time range)  albuterol (PROVENTIL) (2.5 MG/3ML) 0.083% nebulizer solution (has no administration in time range)  ipratropium (ATROVENT) nebulizer solution 0.5 mg (has no administration in time range)  methylPREDNISolone sodium succinate (SOLU-MEDROL) 125 mg/2 mL injection 125 mg (has no administration in time range)    ED Course/ Medical Decision Making/ A&P                           Medical Decision Making Risk OTC drugs. Prescription drug management.  Patient is EKG per interpretation shows sinus tachycardia. Patient treated for bronchospasm with albuterol and Atrovent along with prednisone given Tylenol feels much better.  Patient flu test was positive here.  His chest x-ray per my interpretation did not show any acute infiltrates.  Patient was ambulated and pulse ox never went below 96% on room air.  Patient's lactate noted at 2.8 but he does not appear to be septic at this time.  He feels better at this time and wishes to go home.  I will place patient on prednisone taper as well as give  him albuterol inhaler to go home with.  Return precautions given        Final Clinical Impression(s) / ED Diagnoses Final diagnoses:  None    Rx / DC Orders ED Discharge Orders     None         Lorre Nick, MD 10/22/22 2028

## 2022-10-22 NOTE — ED Notes (Signed)
Patient is being discharged from the Urgent Care and sent to the Emergency Department via pov . Per mound, np, patient is in need of higher level of care due to sob . Patient is aware and verbalizes understanding of plan of care.  Vitals:   10/22/22 1601 10/22/22 1624  BP: 125/78   Pulse: (!) 110   Resp:  (!) 24  Temp: 98.6 F (37 C)   SpO2: 90%

## 2022-10-22 NOTE — ED Notes (Signed)
Patient verbalizes understanding of discharge instructions. Opportunity for questioning and answers were provided. Armband removed by staff, pt discharged from ED. Ambulated out to lobby  

## 2022-10-22 NOTE — ED Triage Notes (Signed)
Pt presents to uc with co of sob, cough congestion and fever since last night. Pt reports motrin

## 2022-10-22 NOTE — ED Notes (Signed)
Pt ambulated with this RN, sats remained 97% and higher throughout walk

## 2022-10-22 NOTE — ED Notes (Signed)
Patient transported to X-ray 

## 2022-10-22 NOTE — ED Provider Notes (Signed)
[facility]    CSN: [id number] Arrival date & time: 10/22/22  1542      History   Chief Complaint Chief Complaint  Patient presents with   Shortness of Breath    HPI Keith Cordova is a 66 y.o. male.   Patient presents with shortness of breath, cough, nasal congestion that started last night.  Patient denies any known fevers or sick contacts. patient reports that he has a history of COPD.  He does not take any daily inhalers or medications for COPD.  He has also not taken any medications for these current symptoms.  Denies chest pain, sore throat, ear pain, nausea, vomiting, diarrhea, abdominal pain.   Shortness of Breath   Past Medical History:  Diagnosis Date   COPD (chronic obstructive pulmonary disease) ([place])    Depression    Hyperlipidemia    Hypertension    Sleep apnea     Patient Active Problem List   Diagnosis Date Noted   Essential hypertension, benign 02/04/2014   Other and unspecified hyperlipidemia 02/04/2014   Smoking 02/04/2014   History of depression 02/04/2014   History of COPD 02/04/2014   History of sleep apnea 02/04/2014    Past Surgical History:  Procedure Laterality Date   APPENDECTOMY     left hand surgery          Home Medications    Prior to Admission medications   Medication Sig Start Date End Date Taking? Authorizing Provider  atorvastatin (LIPITOR) 20 MG tablet Take 1 tablet (20 mg total) by mouth daily for cholesterol 04/02/21     atorvastatin (LIPITOR) 20 MG tablet Take 1 tablet (20 mg total) by mouth daily for cholesterol 10/26/21     bisoprolol-hydrochlorothiazide (ZIAC) 2.5-6.25 MG tablet Take 1 tablet by mouth every morning for blood pressure 04/02/21     bisoprolol-hydrochlorothiazide (ZIAC) 2.5-6.25 MG tablet Take 1 tablet by mouth in the morning for blood pressure 10/26/21     citalopram (CELEXA) 20 MG tablet Take 1 tablet (20 mg total) by mouth every morning for depression/anxiety 04/02/21     citalopram  (CELEXA) 20 MG tablet Take 1 tablet (20 mg total) by mouth in the morning for depression/anxiety 10/26/21     Cyanocobalamin (B-12) 1000 MCG SUBL Place 1 tablet under the tongue daily. 10/27/21     mupirocin ointment (BACTROBAN) 2 % Apply 1 application topically 2 (two) times daily as needed for skin infections. 03/28/22       Family History Family History  Problem Relation Age of Onset   Heart disease Father     Social History Social History   Tobacco Use   Smoking status: Heavy Smoker    Packs/day: 1.00    Years: 50.00    Total pack years: 50.00    Types: Cigarettes  Substance Use Topics   Alcohol use: Yes     Allergies   Morphine   Review of Systems Review of Systems Per HPI  Physical Exam Triage Vital Signs ED Triage Vitals  Enc Vitals Group     BP 10/22/22 1601 125/78     Pulse Rate 10/22/22 1601 (!) 110     Resp 10/22/22 1624 (!) 24     Temp 10/22/22 1601 98.6 F (37 C)     Temp src --      SpO2 10/22/22 1601 90 %     Weight --      Height --      Head Circumference --  Peak Flow --      Pain Score 10/22/22 1602 5     Pain Loc --      Pain Edu? --      Excl. in GC? --    No data found.  Updated Vital Signs BP 125/78 (BP Location: Right Arm)   Pulse (!) 110   Temp 98.6 F (37 C)   Resp (!) 24   SpO2 90%   Visual Acuity Right Eye Distance:   Left Eye Distance:   Bilateral Distance:    Right Eye Near:   Left Eye Near:    Bilateral Near:     Physical Exam Constitutional:      General: He is in acute distress.     Appearance: Normal appearance. He is ill-appearing. He is not toxic-appearing or diaphoretic.     Comments: Patient is rocking back and forth given tachypnea.  HENT:     Head: Normocephalic and atraumatic.     Ears:     Comments: Deferred given tachypnea.    Nose:     Comments: Deferred given tachypnea.    Mouth/Throat:     Comments: Deferred given tachypnea. Eyes:     Extraocular Movements: Extraocular movements  intact.     Conjunctiva/sclera: Conjunctivae normal.     Pupils: Pupils are equal, round, and reactive to light.  Cardiovascular:     Rate and Rhythm: Regular rhythm. Tachycardia present.     Pulses: Normal pulses.     Heart sounds: Normal heart sounds.  Pulmonary:     Effort: No respiratory distress.     Breath sounds: Normal breath sounds. No stridor. No wheezing, rhonchi or rales.     Comments: Patient has tachypnea.  He is having a hard time speaking in complete sentences. Abdominal:     General: Abdomen is flat. Bowel sounds are normal.     Palpations: Abdomen is soft.  Musculoskeletal:        General: Normal range of motion.     Cervical back: Normal range of motion.  Skin:    General: Skin is warm and dry.  Neurological:     General: No focal deficit present.     Mental Status: He is alert and oriented to person, place, and time. Mental status is at baseline.  Psychiatric:        Mood and Affect: Mood normal.        Behavior: Behavior normal.      UC Treatments / Results  Labs (all labs ordered are listed, but only abnormal results are displayed) Labs Reviewed - No data to display  EKG   Radiology No results found.  Procedures Procedures (including critical care time)  Medications Ordered in UC Medications  ipratropium-albuterol (DUONEB) 0.5-2.5 (3) MG/3ML nebulizer solution 3 mL (3 mLs Nebulization Given 10/22/22 1621)    Initial Impression / Assessment and Plan / UC Course  I have reviewed the triage vital signs and the nursing notes.  Pertinent labs & imaging results that were available during my care of the patient were reviewed by me and considered in my medical decision making (see chart for details).     Patient's physical exam is consistent with COPD exacerbation.  He has significant tachypnea and is having difficulty speaking in complete sentences.  Oxygen saturation is ranging from 90 to 93% as well.  DuoNeb was administered with no improvement  in oxygen or tachypnea.  Patient is also mildly tachycardic.  Given all of these factors, I do think  the patient needs a higher level of care and more extensive evaluation than can be provided at urgent care.  Patient was advised to go to ER for further evaluation and management and was agreeable with plan.  Suggested EMS transport but patient declined.  Risks associated with not going via EMS were  discussed with patient.  Patient voiced understanding and still wished to self transport.  Patient left to go to the hospital via self transport.  Advised patient to go straight to the ER as soon as possible. Final Clinical Impressions(s) / UC Diagnoses   Final diagnoses:  Shortness of breath  COPD exacerbation (HCC)  Tachypnea  Low oxygen saturation     Discharge Instructions      Please go straight to the emergency department as soon as you leave urgent care for further evaluation and management.    ED Prescriptions   None    PDMP not reviewed this encounter.   Teodora Medici, [place] 10/22/22 [phone]

## 2022-10-22 NOTE — ED Notes (Signed)
Critical lactic 2.8.  MD Freida Busman notified and aware.  No new orders at this time.

## 2022-10-22 NOTE — ED Notes (Signed)
Trifan MD at bedside 

## 2022-10-22 NOTE — ED Provider Triage Note (Signed)
Emergency Medicine Provider Triage Evaluation Note  Keith Cordova , a 66 y.o. male  was evaluated in triage.  Pt complains of cough, SOB, onset last night.  Wife sick with same symptoms at home.  Pt has COPD and smoker.  Review of Systems  Positive: Cough, congestion Negative: vomiting  Physical Exam  BP 135/70   Pulse (!) 109   Temp (!) 100.8 F (38.2 C)   Resp (!) 22   Ht 5\' 10"  (1.778 m)   Wt 111.1 kg   SpO2 97%   BMI 35.15 kg/m  Gen:   Awake, no distress   Resp:  Labored breathing, tachypneic MSK:   Moves extremities without difficulty   Medical Decision Making  Medically screening exam initiated at 5:32 PM.  Appropriate orders placed.  Andriy Shenker was informed that the remainder of the evaluation will be completed by another provider, this initial triage assessment does not replace that evaluation, and the importance of remaining in the ED until their evaluation is complete.  SOB - infection, COPD eval    Alfredo Bach, MD 10/22/22 1733

## 2022-10-22 NOTE — Discharge Instructions (Addendum)
Please go straight to the emergency department as soon as you leave urgent care for further evaluation and management. 

## 2022-10-23 LAB — CULTURE, BLOOD (ROUTINE X 2)

## 2022-10-27 LAB — CULTURE, BLOOD (ROUTINE X 2): Culture: NO GROWTH

## 2022-11-02 ENCOUNTER — Encounter (HOSPITAL_COMMUNITY): Payer: Self-pay | Admitting: Emergency Medicine

## 2022-11-02 ENCOUNTER — Emergency Department (HOSPITAL_COMMUNITY): Payer: Medicare Other

## 2022-11-02 ENCOUNTER — Observation Stay (HOSPITAL_COMMUNITY)
Admission: EM | Admit: 2022-11-02 | Discharge: 2022-11-04 | Disposition: A | Discharge status: Home / Self Care | Payer: Medicare Other | Attending: Internal Medicine | Admitting: Internal Medicine

## 2022-11-02 ENCOUNTER — Other Ambulatory Visit: Payer: Self-pay

## 2022-11-02 DIAGNOSIS — I2510 Atherosclerotic heart disease of native coronary artery without angina pectoris: Secondary | ICD-10-CM | POA: Diagnosis not present

## 2022-11-02 DIAGNOSIS — Z79899 Other long term (current) drug therapy: Secondary | ICD-10-CM | POA: Diagnosis not present

## 2022-11-02 DIAGNOSIS — J189 Pneumonia, unspecified organism: Secondary | ICD-10-CM | POA: Diagnosis not present

## 2022-11-02 DIAGNOSIS — R918 Other nonspecific abnormal finding of lung field: Secondary | ICD-10-CM | POA: Diagnosis not present

## 2022-11-02 DIAGNOSIS — R17 Unspecified jaundice: Secondary | ICD-10-CM | POA: Diagnosis not present

## 2022-11-02 DIAGNOSIS — J441 Chronic obstructive pulmonary disease with (acute) exacerbation: Secondary | ICD-10-CM | POA: Insufficient documentation

## 2022-11-02 DIAGNOSIS — J9601 Acute respiratory failure with hypoxia: Principal | ICD-10-CM | POA: Insufficient documentation

## 2022-11-02 DIAGNOSIS — I700 Atherosclerosis of aorta: Secondary | ICD-10-CM | POA: Diagnosis not present

## 2022-11-02 DIAGNOSIS — R0602 Shortness of breath: Secondary | ICD-10-CM | POA: Diagnosis not present

## 2022-11-02 DIAGNOSIS — E876 Hypokalemia: Secondary | ICD-10-CM | POA: Diagnosis not present

## 2022-11-02 DIAGNOSIS — Z1152 Encounter for screening for COVID-19: Secondary | ICD-10-CM | POA: Insufficient documentation

## 2022-11-02 DIAGNOSIS — R0600 Dyspnea, unspecified: Secondary | ICD-10-CM | POA: Diagnosis present

## 2022-11-02 DIAGNOSIS — I10 Essential (primary) hypertension: Secondary | ICD-10-CM | POA: Diagnosis not present

## 2022-11-02 DIAGNOSIS — R079 Chest pain, unspecified: Secondary | ICD-10-CM | POA: Diagnosis not present

## 2022-11-02 LAB — HEPATIC FUNCTION PANEL
ALT: 36 U/L (ref 0–44)
AST: 29 U/L (ref 15–41)
Albumin: 3.3 g/dL — ABNORMAL LOW (ref 3.5–5.0)
Alkaline Phosphatase: 51 U/L (ref 38–126)
Bilirubin, Direct: 0.4 mg/dL — ABNORMAL HIGH (ref 0.0–0.2)
Indirect Bilirubin: 1.4 mg/dL — ABNORMAL HIGH (ref 0.3–0.9)
Total Bilirubin: 1.8 mg/dL — ABNORMAL HIGH (ref 0.3–1.2)
Total Protein: 6.2 g/dL — ABNORMAL LOW (ref 6.5–8.1)

## 2022-11-02 LAB — LACTIC ACID, PLASMA: Lactic Acid, Venous: 1.9 mmol/L (ref 0.5–1.9)

## 2022-11-02 LAB — BASIC METABOLIC PANEL
Anion gap: 10 (ref 5–15)
BUN: 9 mg/dL (ref 8–23)
CO2: 22 mmol/L (ref 22–32)
Calcium: 8.8 mg/dL — ABNORMAL LOW (ref 8.9–10.3)
Chloride: 104 mmol/L (ref 98–111)
Creatinine, Ser: 0.95 mg/dL (ref 0.61–1.24)
GFR, Estimated: >60 mL/min (ref >60)
Glucose, Bld: 174 mg/dL — ABNORMAL HIGH (ref 70–99)
Potassium: 3 mmol/L — ABNORMAL LOW (ref 3.5–5.1)
Sodium: 136 mmol/L (ref 135–145)

## 2022-11-02 LAB — CBC
HCT: 42.4 % (ref 39.0–52.0)
Hemoglobin: 15.7 g/dL (ref 13.0–17.0)
MCH: 34.1 pg — ABNORMAL HIGH (ref 26.0–34.0)
MCHC: 37 g/dL — ABNORMAL HIGH (ref 30.0–36.0)
MCV: 92 fL (ref 80.0–100.0)
Platelets: 324 10*3/uL (ref 150–400)
RBC: 4.61 MIL/uL (ref 4.22–5.81)
RDW: 11.9 % (ref 11.5–15.5)
WBC: 15.7 10*3/uL — ABNORMAL HIGH (ref 4.0–10.5)
nRBC: 0 % (ref 0.0–0.2)

## 2022-11-02 LAB — RESP PANEL BY RT-PCR (RSV, FLU A&B, COVID)  RVPGX2
Influenza A by PCR: NEGATIVE
Influenza B by PCR: NEGATIVE
Resp Syncytial Virus by PCR: NEGATIVE
SARS Coronavirus 2 by RT PCR: NEGATIVE

## 2022-11-02 LAB — TROPONIN I (HIGH SENSITIVITY)
Troponin I (High Sensitivity): 8 ng/L (ref <18)
Troponin I (High Sensitivity): 12 ng/L (ref <18)
Troponin I (High Sensitivity): 7 ng/L (ref <18)

## 2022-11-02 LAB — BRAIN NATRIURETIC PEPTIDE: B Natriuretic Peptide: 68.6 pg/mL (ref 0.0–100.0)

## 2022-11-02 LAB — HIV ANTIBODY (ROUTINE TESTING W REFLEX): HIV Screen 4th Generation wRfx: NONREACTIVE

## 2022-11-02 LAB — PROCALCITONIN: Procalcitonin: <0.1 ng/mL

## 2022-11-02 MED ORDER — LORAZEPAM 1 MG PO TABS
0.5000 mg | ORAL_TABLET | Freq: Once | ORAL | Status: DC
  Filled 2022-11-02: qty 1

## 2022-11-02 MED ORDER — UMECLIDINIUM-VILANTEROL 62.5-25 MCG/ACT IN AEPB
1.0000 | INHALATION_SPRAY | Freq: Every day | RESPIRATORY_TRACT | Status: DC
  Filled 2022-11-02: qty 14

## 2022-11-02 MED ORDER — SODIUM CHLORIDE 0.9 % IV SOLN
1.0000 g | Freq: Once | INTRAVENOUS | Status: AC
  Administered 2022-11-02: 1 g via INTRAVENOUS
  Filled 2022-11-02: qty 10

## 2022-11-02 MED ORDER — ENOXAPARIN SODIUM 40 MG/0.4ML IJ SOSY
40.0000 mg | PREFILLED_SYRINGE | INTRAMUSCULAR | Status: DC
  Administered 2022-11-02: 40 mg via SUBCUTANEOUS
  Filled 2022-11-02: qty 0.4

## 2022-11-02 MED ORDER — PREDNISONE 20 MG PO TABS
40.0000 mg | ORAL_TABLET | Freq: Every day | ORAL | Status: DC
  Administered 2022-11-03: 40 mg via ORAL
  Filled 2022-11-02: qty 2

## 2022-11-02 MED ORDER — BENZONATATE 100 MG PO CAPS
100.0000 mg | ORAL_CAPSULE | Freq: Three times a day (TID) | ORAL | Status: DC | PRN
  Administered 2022-11-02 – 2022-11-03 (×2): 100 mg via ORAL
  Filled 2022-11-02 (×2): qty 1

## 2022-11-02 MED ORDER — SODIUM CHLORIDE 0.9 % IV SOLN
500.0000 mg | Freq: Once | INTRAVENOUS | Status: AC
  Administered 2022-11-02: 500 mg via INTRAVENOUS
  Filled 2022-11-02: qty 5

## 2022-11-02 MED ORDER — IPRATROPIUM-ALBUTEROL 0.5-2.5 (3) MG/3ML IN SOLN: 3.0000 mL | Freq: Four times a day (QID) | RESPIRATORY_TRACT | Status: DC | PRN

## 2022-11-02 MED ORDER — UMECLIDINIUM BROMIDE 62.5 MCG/ACT IN AEPB
1.0000 | INHALATION_SPRAY | Freq: Every day | RESPIRATORY_TRACT | Status: DC
  Filled 2022-11-02: qty 7

## 2022-11-02 MED ORDER — SENNOSIDES-DOCUSATE SODIUM 8.6-50 MG PO TABS: 1.0000 | ORAL_TABLET | Freq: Every evening | ORAL | Status: DC | PRN

## 2022-11-02 MED ORDER — POTASSIUM CHLORIDE CRYS ER 20 MEQ PO TBCR
40.0000 meq | EXTENDED_RELEASE_TABLET | Freq: Once | ORAL | Status: AC
  Administered 2022-11-02: 40 meq via ORAL
  Filled 2022-11-02: qty 2

## 2022-11-02 MED ORDER — QUETIAPINE FUMARATE 25 MG PO TABS: 25.0000 mg | ORAL_TABLET | Freq: Every evening | ORAL | Status: DC | PRN

## 2022-11-02 MED ORDER — ATORVASTATIN CALCIUM 10 MG PO TABS
20.0000 mg | ORAL_TABLET | Freq: Every day | ORAL | Status: DC
  Administered 2022-11-02 – 2022-11-04 (×3): 20 mg via ORAL
  Filled 2022-11-02 (×3): qty 2

## 2022-11-02 MED ORDER — IPRATROPIUM-ALBUTEROL 0.5-2.5 (3) MG/3ML IN SOLN
3.0000 mL | Freq: Once | RESPIRATORY_TRACT | Status: AC
  Administered 2022-11-02: 3 mL via RESPIRATORY_TRACT
  Filled 2022-11-02: qty 3

## 2022-11-02 MED ORDER — AZITHROMYCIN 500 MG PO TABS
500.0000 mg | ORAL_TABLET | Freq: Every day | ORAL | Status: DC
  Administered 2022-11-03: 500 mg via ORAL
  Filled 2022-11-02: qty 1

## 2022-11-02 MED ORDER — IOHEXOL 350 MG/ML SOLN
50.0000 mL | Freq: Once | INTRAVENOUS | Status: AC | PRN
  Administered 2022-11-02: 50 mL via INTRAVENOUS

## 2022-11-02 MED ORDER — LORAZEPAM 1 MG PO TABS
1.0000 mg | ORAL_TABLET | Freq: Once | ORAL | Status: AC
  Administered 2022-11-02: 1 mg via ORAL

## 2022-11-02 MED ORDER — CITALOPRAM HYDROBROMIDE 20 MG PO TABS
20.0000 mg | ORAL_TABLET | Freq: Every day | ORAL | Status: DC
  Administered 2022-11-02 – 2022-11-04 (×3): 20 mg via ORAL
  Filled 2022-11-02 (×2): qty 1
  Filled 2022-11-02: qty 2

## 2022-11-02 MED ORDER — SODIUM CHLORIDE 0.9 % IV BOLUS
500.0000 mL | Freq: Once | INTRAVENOUS | Status: AC
  Administered 2022-11-02: 500 mL via INTRAVENOUS

## 2022-11-02 MED ORDER — ACETAMINOPHEN 325 MG PO TABS
650.0000 mg | ORAL_TABLET | Freq: Four times a day (QID) | ORAL | Status: DC | PRN
  Administered 2022-11-02 – 2022-11-03 (×2): 650 mg via ORAL
  Filled 2022-11-02 (×2): qty 2

## 2022-11-02 NOTE — ED Notes (Signed)
Patient transported to CT in NAD.

## 2022-11-02 NOTE — Evaluation (Signed)
Physical Therapy Evaluation Patient Details Name: Keith Cordova MRN: [id number] DOB: Mar 21, 1956 Today's Date: 11/02/2022  History of Present Illness  66 yo male [facility] on [facility]/27 with nonproductive cough, ShOB, constipation, N/v, poor appetite for the past week since recent dx of flu. Pt with multilobar pna. PMH includes COPD, depression, HTN, HLD.  Clinical Impression   Pt presents with generalized weakness, impaired balance, impaired gait, dyspnea on exertion, and decreased activity tolerance. Pt to benefit from acute PT to address deficits. Pt ambulated hallway distance without AD on 3LO2, at baseline pt ambulates on RA and is independent. PT anticipates no follow up PT needs. PT to progress mobility as tolerated, and will continue to follow acutely.         Recommendations for follow up therapy are one component of a multi-disciplinary discharge planning process, led by the attending physician.  Recommendations may be updated based on patient status, additional functional criteria and insurance authorization.  Follow Up Recommendations No PT follow up      Assistance Recommended at Discharge Set up Supervision/Assistance  Patient can return home with the following  A little help with walking and/or transfers;A little help with bathing/dressing/bathroom    Equipment Recommendations None recommended by PT  Recommendations for Other Services       Functional Status Assessment Patient has had a recent decline in their functional status and demonstrates the ability to make significant improvements in function in a reasonable and predictable amount of time.     Precautions / Restrictions Precautions Precautions: Fall Precaution Comments: on 3LO2 Restrictions Weight Bearing Restrictions: No      Mobility  Bed Mobility Overal bed mobility: Needs Assistance Bed Mobility: Supine to Sit, Sit to Supine     Supine to sit: Supervision, HOB elevated Sit to supine:  Supervision, HOB elevated        Transfers Overall transfer level: Needs assistance Equipment used: None Transfers: Sit to/from Stand Sit to Stand: Min guard                Ambulation/Gait Ambulation/Gait assistance: Min guard Gait Distance (Feet): 160 Feet Assistive device: None Gait Pattern/deviations: Step-through pattern, Decreased stride length, Drifts right/left Gait velocity: decr     General Gait Details: min drift L/R, pt endorses "staggering" but unsure if this is pt baseline. DOE 2/4, on 3LO2 during Insurance risk surveyor    Modified Rankin (Stroke Patients Only)       Balance Overall balance assessment: Needs assistance Sitting-balance support: No upper extremity supported, Feet supported Sitting balance-Leahy Scale: Good     Standing balance support: No upper extremity supported, During functional activity Standing balance-Leahy Scale: Fair                               Pertinent Vitals/Pain Pain Assessment Pain Assessment: No/denies pain    Home Living Family/patient expects to be discharged to:: Private residence Living Arrangements: Spouse/significant other;Children Available Help at Discharge: Family Type of Home: House Home Access: Stairs to enter   Secretary/administrator of Steps: 1   Home Layout: Multi-level;Able to live on main level with bedroom/bathroom Home Equipment: Gilmer Mor - single point      Prior Function Prior Level of Function : Independent/Modified Independent                     Hand Dominance  Dominant Hand: Right    Extremity/Trunk Assessment   Upper Extremity Assessment Upper Extremity Assessment: Defer to OT evaluation    Lower Extremity Assessment Lower Extremity Assessment: Generalized weakness    Cervical / Trunk Assessment Cervical / Trunk Assessment: Normal  Communication   Communication: No difficulties  Cognition Arousal/Alertness:  Awake/alert Behavior During Therapy: WFL for tasks assessed/performed Overall Cognitive Status: Impaired/Different from baseline Area of Impairment: Problem solving, Safety/judgement                         Safety/Judgement: Decreased awareness of safety, Decreased awareness of deficits   Problem Solving: Requires verbal cues, Requires tactile cues          General Comments      Exercises     Assessment/Plan    PT Assessment Patient needs continued PT services  PT Problem List Decreased mobility;Decreased activity tolerance;Decreased balance;Decreased knowledge of use of DME;Cardiopulmonary status limiting activity;Decreased strength       PT Treatment Interventions DME instruction;Therapeutic activities;Gait training;Therapeutic exercise;Patient/family education;Balance training;Stair training;Neuromuscular re-education;Functional mobility training    PT Goals (Current goals can be found in the Care Plan section)  Acute Rehab PT Goals Patient Stated Goal: home PT Goal Formulation: With patient Time For Goal Achievement: 11/16/22 Potential to Achieve Goals: Good    Frequency Min 3X/week     Co-evaluation               AM-PAC PT "6 Clicks" Mobility  Outcome Measure Help needed turning from your back to your side while in a flat bed without using bedrails?: A Little Help needed moving from lying on your back to sitting on the side of a flat bed without using bedrails?: A Little Help needed moving to and from a bed to a chair (including a wheelchair)?: A Little Help needed standing up from a chair using your arms (e.g., wheelchair or bedside chair)?: A Little Help needed to walk in hospital room?: A Little Help needed climbing 3-5 steps with a railing? : A Little 6 Click Score: 18    End of Session Equipment Utilized During Treatment: Oxygen Activity Tolerance: Patient tolerated treatment well Patient left: in bed;with call bell/phone within  reach Nurse Communication: Mobility status PT Visit Diagnosis: Other abnormalities of gait and mobility (R26.89);Muscle weakness (generalized) (M62.81)    Time: 1610-9604 PT Time Calculation (min) (ACUTE ONLY): 17 min   Charges:   PT Evaluation $PT Eval Low Complexity: 1 Low        Emili Mcloughlin S, PT DPT Acute Rehabilitation Services Pager [phone]  Office [phone]   Truddie Coco 11/02/2022, 4:57 PM

## 2022-11-02 NOTE — ED Notes (Signed)
Lab called to add on pct.

## 2022-11-02 NOTE — ED Provider Triage Note (Signed)
  Emergency Medicine Provider Triage Evaluation Note  MRN:  [id number]  Arrival date & time: 11/02/22    Medically screening exam initiated at 4:21 AM.   CC:   Shortness of Breath   HPI:  Keith Cordova is a 66 y.o. year-old male presents to the ED with chief complaint of shortness of breath.  Diagnosed with the flu last week.  Hx of COPD.  Reports worsening SOB.  Hypoxic to 88% on RA.  Tachycardic.  States that he feels like he is going to have a panic attack.  History provided by patient. ROS:  -As included in HPI PE:   Vitals:   11/02/22 0416 11/02/22 0420  BP: (!) 124/95   Pulse: (!) 128   Resp: (!) 28   Temp: 99.1 F (37.3 C)   SpO2: (!) 88% 93%    Non-toxic appearing Mild distress, increased WOB anxious MDM:   I've ordered labs and imaging in triage to expedite lab/diagnostic workup.  Patient was informed that the remainder of the evaluation will be completed by another provider, this initial triage assessment does not replace that evaluation, and the importance of remaining in the ED until their evaluation is complete.    Roxy Horseman, PA-C 11/02/22 [phone]

## 2022-11-02 NOTE — Hospital Course (Addendum)
Keith Cordova is a 66 y.o. male with past medical history of OSA, COPD, hypertension, depression who presents to the emergency room with concerns of dyspnea for the past 2 weeks.  Patient admitted for bronchopneumonia.   #Acute hypoxic respiratory failure likely secondary to postviral pneumonia #COPD exacerbation Patient initially came to the emergency room with concerns of 2-week history of shortness of breath.  Patient was recovering from a viral illness, which was likely contracted from his partner who was also sick.  Patient states he had worsening cough and shortness of breath with ambulation.  Initial chest x-ray was concerning for possible bronchopneumonia.  Patient was initially started on systemic steroids as well as azithromycin for COPD exacerbation.  Patient was also started on Anoro Ellipta.  During hospitalization, patient's respiratory status improved, patient was taken off of oxygen, and transition to room air.  Steroids were discontinued, as patient cannot tolerate them.  Patient was also switched from azithromycin to Augmentin, to complete CAP coverage.  Patient to be discharged with Anoro Ellipta.  Patient also to be discharged with course of Augmentin to finish.  Patient will be followed up with home health PT.  Patient also states he will follow-up with his PCP outpatient.   #Hypokalemia Patient did have decreased potassium during hospitalization, and it was supplemented.   #Hypertension Patient was continued on home bisoprolol-HCTZ 2.5-6.25 mg tablet daily during hospitalization.   #Insomnia #Depression Patient was continued on home quetiapine 25 mg daily as needed and citalopram 20 mg daily.   #HLD Chronic.  Patient was continued on home atorvastatin 20 mg daily during hospitalization.   #Elevated bilirubin Patient initial he had an elevated bilirubin on admission.  It resolved the next day.  No concerns during hospitalization.

## 2022-11-02 NOTE — ED Provider Notes (Signed)
[facility] EMERGENCY DEPARTMENT Provider Note   CSN: [id number] Arrival date & time: 11/02/22  0405     History  Chief Complaint  Patient presents with   Shortness of Breath    Keith Cordova is a 66 y.o. male.  The history is provided by the patient and medical records.  Shortness of Breath Keith Cordova is a 66 y.o. male who presents to the Emergency Department complaining of sob.  He presents to the emerged  department for evaluation of shortness of breath that started about 2 weeks ago.  He was seen in the emergency department and diagnosed with flu and he was treated with steroids.  He states that he thought that he felt worse with the steroids and he stopped taking them a few days ago.  He complains of cough that is nonproductive.  He has associated fever, poor appetite and central chest pain.  He feels very short of breath.  No leg swelling or pain.  No vomiting.  He has a history of COPD, hypertension, hyperlipidemia.  No history of clotting disorders.  He does not use any inhalers at home for his COPD.  Smokes tobacco, drinks alcohol 5-6 beers daily but has not for the last two weeks.  No street drugs.      Home Medications Prior to Admission medications   Medication Sig Start Date End Date Taking? Authorizing Provider  atorvastatin (LIPITOR) 20 MG tablet Take 1 tablet (20 mg total) by mouth daily for cholesterol 10/26/21  Yes   bisoprolol-hydrochlorothiazide (ZIAC) 2.5-6.25 MG tablet Take 1 tablet by mouth in the morning for blood pressure 10/26/21  Yes   citalopram (CELEXA) 20 MG tablet Take 1 tablet (20 mg total) by mouth in the morning for depression/anxiety 10/26/21  Yes   QUEtiapine (SEROQUEL) 25 MG tablet Take 25 mg by mouth at bedtime as needed (for sleep).   Yes [provider]  Cyanocobalamin (B-12) 1000 MCG SUBL Place 1 tablet under the tongue daily. 10/27/21     mupirocin ointment (BACTROBAN) 2 % Apply 1 application topically 2  (two) times daily as needed for skin infections. Patient not taking: Reported on 11/02/2022 03/28/22     predniSONE (STERAPRED UNI-PAK 21 TAB) 10 MG (21) TBPK tablet Take by mouth daily. Take 6 tabs by mouth daily  for 2 days, then 5 tabs for 2 days, then 4 tabs for 2 days, then 3 tabs for 2 days, 2 tabs for 2 days, then 1 tab by mouth daily for 2 days Patient not taking: Reported on 11/02/2022 10/22/22   Lorre Nick, MD      Allergies    Morphine and Prednisone    Review of Systems   Review of Systems  Respiratory:  Positive for shortness of breath.   All other systems reviewed and are negative.   Physical Exam Updated Vital Signs BP (!) 154/97 (BP Location: Left Arm)   Pulse (!) 110   Temp 98.9 F (37.2 C) (Oral)   Resp (!) 24   Wt 111 kg   SpO2 94%   BMI 35.11 kg/m  Physical Exam Vitals and nursing note reviewed.  Constitutional:      General: He is in acute distress.     Appearance: He is well-developed.  HENT:     Head: Normocephalic and atraumatic.  Cardiovascular:     Rate and Rhythm: Regular rhythm. Tachycardia present.     Heart sounds: No murmur heard. Pulmonary:     Comments: Frequent coughing  and tachypnea without respiratory distress.  There is good air movement bilaterally with occasional end expiratory wheezes in the left upper lung field.  There are occasional rhonchi in the right lower lung field. Abdominal:     Palpations: Abdomen is soft.     Tenderness: There is no abdominal tenderness. There is no guarding or rebound.  Musculoskeletal:        General: No swelling or tenderness.  Skin:    General: Skin is warm and dry.  Neurological:     Mental Status: He is alert and oriented to person, place, and time.  Psychiatric:        Behavior: Behavior normal.     ED Results / Procedures / Treatments   Labs (all labs ordered are listed, but only abnormal results are displayed) Labs Reviewed  BASIC METABOLIC PANEL - Abnormal; Notable for the  following components:      Result Value   Potassium 3.0 (*)    Glucose, Bld 174 (*)    Calcium 8.8 (*)    All other components within normal limits  CBC - Abnormal; Notable for the following components:   WBC 15.7 (*)    MCH 34.1 (*)    MCHC 37.0 (*)    All other components within normal limits  HEPATIC FUNCTION PANEL - Abnormal; Notable for the following components:   Total Protein 6.2 (*)    Albumin 3.3 (*)    Total Bilirubin 1.8 (*)    Bilirubin, Direct 0.4 (*)    Indirect Bilirubin 1.4 (*)    All other components within normal limits  RESP PANEL BY RT-PCR (RSV, FLU A&B, COVID)  RVPGX2  BRAIN NATRIURETIC PEPTIDE  LACTIC ACID, PLASMA  LACTIC ACID, PLASMA  TROPONIN I (HIGH SENSITIVITY)  TROPONIN I (HIGH SENSITIVITY)  TROPONIN I (HIGH SENSITIVITY)  TROPONIN I (HIGH SENSITIVITY)    EKG EKG Interpretation  Date/Time:  Wednesday November 02 2022 04:19:46 EST Ventricular Rate:  127 PR Interval:  134 QRS Duration: 70 QT Interval:  314 QTC Calculation: 456 R Axis:   77 Text Interpretation: Poor data quality, interpretation may be adversely affected Sinus tachycardia Nonspecific ST and T wave abnormality Abnormal ECG Confirmed by Tilden Fossa [phone]) on 11/02/2022 5:05:08 AM  Radiology CT Angio Chest PE W/Cm &/Or Wo Cm  Result Date: 11/02/2022 CLINICAL DATA:  66 year old male with history of chest pain and shortness of breath. EXAM: CT ANGIOGRAPHY CHEST WITH CONTRAST TECHNIQUE: Multidetector CT imaging of the chest was performed using the standard protocol during bolus administration of intravenous contrast. Multiplanar CT image reconstructions and MIPs were obtained to evaluate the vascular anatomy. RADIATION DOSE REDUCTION: This exam was performed according to the departmental dose-optimization program which includes automated exposure control, adjustment of the mA and/or kV according to patient size and/or use of iterative reconstruction technique. CONTRAST:  13mL OMNIPAQUE  IOHEXOL 350 MG/ML SOLN COMPARISON:  No priors. FINDINGS: Cardiovascular: No filling defects within the pulmonary arterial tree to suggest pulmonary embolism. Heart size is normal. There is no significant pericardial fluid, thickening or pericardial calcification. There is aortic atherosclerosis, as well as atherosclerosis of the great vessels of the mediastinum and the coronary arteries, including calcified atherosclerotic plaque in the left anterior descending and right coronary arteries. Mediastinum/Nodes: No pathologically enlarged mediastinal or hilar lymph nodes. Several prominent but nonenlarged mediastinal and hilar lymph nodes are incidentally noted. Esophagus is unremarkable in appearance. No axillary lymphadenopathy. Lungs/Pleura: Diffuse bronchial wall thickening, thickening of the peribronchovascular interstitium and patchy areas of  peribronchovascular ground-glass attenuation micro and macronodularity, along with some scattered peribronchovascular consolidative changes, most severe in the medial left lower lobe, and posterior right upper lobe. No pleural effusions. No other larger more suspicious appearing pulmonary nodules or masses are noted. Upper Abdomen: Atherosclerotic calcifications in the abdominal aorta. Musculoskeletal: There are no aggressive appearing lytic or blastic lesions noted in the visualized portions of the skeleton. Review of the MIP images confirms the above findings. IMPRESSION: 1. No evidence of pulmonary embolism. 2. The appearance of the lungs is most suggestive of severe bronchitis with developing multilobar bilateral bronchopneumonia, as above. 3. Aortic atherosclerosis, in addition to 2 vessel coronary artery disease. Please note that although the presence of coronary artery calcium documents the presence of coronary artery disease, the severity of this disease and any potential stenosis cannot be assessed on this non-gated CT examination. Assessment for potential risk  factor modification, dietary therapy or pharmacologic therapy may be warranted, if clinically indicated. Aortic Atherosclerosis (ICD10-I70.0). Electronically Signed   By: Trudie Reed M.D.   On: 11/02/2022 07:08   DG Chest 2 View  Result Date: 11/02/2022 CLINICAL DATA:  66 year old male with history of shortness of breath and chest pain. EXAM: CHEST - 2 VIEW COMPARISON:  Chest x-ray 10/22/2022. FINDINGS: Lung volumes are normal. No consolidative airspace disease. However, there is diffuse interstitial prominence and widespread peribronchial cuffing throughout the lungs bilaterally, most severe in the right lower lung. No pleural effusions. No pneumothorax. No pulmonary nodule or mass noted. Pulmonary vasculature and the cardiomediastinal silhouette are within normal limits. Atherosclerosis in the thoracic aorta. IMPRESSION: 1. Findings are concerning for an acute bronchitis, potentially with developing bronchopneumonia in the right lung base. Followup PA and lateral chest X-ray is recommended in 3-4 weeks following trial of antibiotic therapy to ensure resolution and exclude underlying malignancy. 2. Aortic atherosclerosis. Electronically Signed   By: Trudie Reed M.D.   On: 11/02/2022 05:41    Procedures Procedures    Medications Ordered in ED Medications  azithromycin (ZITHROMAX) 500 mg in sodium chloride 0.9 % 250 mL IVPB (500 mg Intravenous New Bag/Given 11/02/22 0643)  LORazepam (ATIVAN) tablet 1 mg (1 mg Oral Given 11/02/22 0459)  ipratropium-albuterol (DUONEB) 0.5-2.5 (3) MG/3ML nebulizer solution 3 mL (3 mLs Nebulization Given 11/02/22 0543)  potassium chloride SA (KLOR-CON M) CR tablet 40 mEq (40 mEq Oral Given 11/02/22 0542)  cefTRIAXone (ROCEPHIN) 1 g in sodium chloride 0.9 % 100 mL IVPB (0 g Intravenous Stopped 11/02/22 0646)  sodium chloride 0.9 % bolus 500 mL (500 mLs Intravenous New Bag/Given 11/02/22 0621)  iohexol (OMNIPAQUE) 350 MG/ML injection 50 mL (50 mLs Intravenous  Contrast Given 11/02/22 0272)    ED Course/ Medical Decision Making/ A&P                           Medical Decision Making Amount and/or Complexity of Data Reviewed Labs: ordered. Radiology: ordered.  Risk Prescription drug management. Decision regarding hospitalization.   Patient with history of COPD here for evaluation of shortness of breath and cough that started about 2 weeks ago.  He did recently have influenza.  He states that he is feeling worse since recent ED visit.  He did not complete his steroids as prescribed.  He does have a new oxygen requirement of 2 L nasal cannula.  He is very anxious and he was treated with oral Ativan.  He was treated with DuoNeb for possible COPD exacerbation.  No significant  change in his symptoms with DuoNeb.  Chest x-ray concerning for possible developing right lower lobe pneumonia-images personally reviewed and interpreted, agree with radiologist interpretation.  Given his symptoms concern for potential PE and a CTA was obtained.  CTA is negative for PE but does demonstrate multi lobar pneumonia.  Patient was treated with antibiotics for community-acquired pneumonia.  Discussed with patient findings of studies recommendation for admission and he is in agreement with treatment plan.  Hospitalist consulted for admission.        Final Clinical Impression(s) / ED Diagnoses Final diagnoses:  Multifocal pneumonia    Rx / DC Orders ED Discharge Orders     None         Tilden Fossaees, Karenna Romanoff, MD 11/02/22 [phone]

## 2022-11-02 NOTE — H&P (Signed)
Date: 11/02/2022               Patient Name:  Keith Cordova MRN: [id number]  DOB: 03-12-1956 Age / Sex: 66 y.o., male   PCP: Judith Part, MD         Medical Service: Internal Medicine Teaching Service         Attending Physician: Dr. Earl Lagos, MD    First Contact: Dr. Willette Cluster Pager: [phone]  Second Contact: Dr. Champ Mungo Pager: [phone]       After Hours (After 5p/  First Contact Pager: [phone]  weekends / holidays): Second Contact Pager: [phone]   Chief Complaint: dyspnea  History of Present Illness: [name] is a 66 y.o. male with past medical history of OSA,COPD, HTN, depression, and insomnia, who presents with increased dyspnea for 2 weeks.  Patient was recently diagnosed with influenza and given steroids to take as an outpatient, which he took for 6 days days but abruptly stopped taking due to feeling like his mood was adversely affected. At that time he was experiencing palpitations, headaches, back pain and myalgias, and worsened insomnia.   Patient says this cough is different from his normal COPD cough due to the worse dyspnea than normal. He is not having more cough than normal but does have significant cough in the morning. He denies chest pain except for when he is coughing. He can lay flat to sleep at night however is minimally sleeping due to his cough. He denies vomiting or diarrhea, but has had some nausea and constipation, and oliguria. He does not have to strain to urinate but has little output with some 'dribbling' of urine. PO intake has been poor. He denies myalgias, palpitations, headaches at this time. Overall the dyspnea he was experiencing 2 weeks ago is unchanged. He has taken ibuprofen for symptoms.   ED Course: Patient required 2L [place] on presentation. He was given PO ativan 1 mg for anxiety and DuoNeb treatment without significant improvement of dyspnea. CXR showed signs of acute bronchitis and possible developing bronchopneumonia.  CTA chest was negative for PE but showed signs of development of severe bronchitis with developing multilobar bilateral bronchopneumonia. He received one dose of azithromycin 500 mg IV and ceftriaxone 1 g IV.   Meds:  Current Meds  Medication Sig   atorvastatin (LIPITOR) 20 MG tablet Take 1 tablet (20 mg total) by mouth daily for cholesterol   bisoprolol-hydrochlorothiazide (ZIAC) 2.5-6.25 MG tablet Take 1 tablet by mouth in the morning for blood pressure   citalopram (CELEXA) 20 MG tablet Take 1 tablet (20 mg total) by mouth in the morning for depression/anxiety   QUEtiapine (SEROQUEL) 25 MG tablet Take 25 mg by mouth at bedtime as needed (for sleep).    Allergies: Allergies as of 11/02/2022 - Review Complete 11/02/2022  Allergen Reaction Noted   Morphine Rash 06/16/2015   Prednisone Anxiety 11/02/2022   Past Medical History:  Diagnosis Date   COPD (chronic obstructive pulmonary disease) (HCC)    Depression    Hyperlipidemia    Hypertension    Sleep apnea     Family History:  M: M, CVA, HTN F: M, CVA, HTN, dementia 10 siblings with various medical conditions  Social History: Patient lives with his youngest daughter and ex-wife. He has good support at home. 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 He is independent in ADLs and IADLs. He drinks 5-7 beers daily, but last drink was three  Friday's ago. He hasn't had alcohol recently due to acute illness and denies prior alcohol withdrawal. He smokes tobacco, 1PPD, for about 60 years. He has not smoked since three Friday's ago due to acute illness. He denies other illicit substance use. PCP is Dr. Dory Peru at [facility] in [place], [place].  Review of Systems: A complete ROS was negative except as per HPI.   Physical Exam: Blood pressure 98/69, pulse (!) 106, temperature 98.9 F (37.2 C), temperature source Oral, resp. rate 19, height 5\' 10"  (1.778 m), weight 111 kg, SpO2 96 %.  Gen: Chronically ill appearing, anxious,  unkempt, cooperative and laying in bed HEENT: EOMI, no scleral icterus, dry mucous membranes, poor dentition CV: tachycardic, regular rhythm, no m/r/g, 2+ bilateral radial and DP pulses Pulm: Increased work of breathing on 2L [place], bibasilar crackles, no wheezes, good airflow throughout, productive cough Abd: soft, NT, distended, no rebound or guarding, normal active bowel sounds Extremities: warm, dry, no LE edema Skin: no juandice  EKG: personally reviewed my interpretation is NSR, supraventricular bigeminy, and prolonged QTc.  CXR: Findings are concerning for an acute bronchitis, potentially with developing bronchopneumonia in the right lung base. Followup PA and lateral chest X-ray is recommended in 3-4 weeks following trial of antibiotic therapy to ensure resolution and exclude underlying malignancy. 2. Aortic atherosclerosis.  CTA Chest: 1. No evidence of pulmonary embolism. 2. The appearance of the lungs is most suggestive of severe bronchitis with developing multilobar bilateral bronchopneumonia, as above. 3. Aortic atherosclerosis, in addition to 2 vessel coronary artery disease. Please note that although the presence of coronary artery calcium documents the presence of coronary artery disease, the severity of this disease and any potential stenosis cannot be assessed on this non-gated CT examination. Assessment for potential risk factor modification, dietary therapy or pharmacologic therapy may be warranted, if clinically indicated.  Assessment & Plan by Problem: Principal Problem:   Acute respiratory failure with hypoxia (HCC)  Acute respiratory failure with hypoxia 2/2 post-viral pneumonia  COPD Given recent influenza infection, I suspect imaging findings are a post-viral pneumonia. His presentation is likely complicated further by abrupt cessation of steroids after diagnosis with influenza. He does not require supplemental oxygen at home but is saturating well on 2L [place] on exam. No  wheezing on lung exam however he had received DuoNeb treatment prior. Procalcitonin is negative, which at face value decreases concern for bacterial infection however in setting of prolonged illness, new oxygen requirement, and imaging findings, I am concerned for COPD exacerbation due to the recent illness. Tachycardic without fever, has a leukocytosis. -Anoro Ellipta 1 puff daily -DuoNeb q6h PRN -Azithromycin 500 mg daily for 3 days total -Prednisone 40 mg daily for 5 days -Wean O2 as tolerated -Tessalon Perles TID PRN cough -PT/OT -Recommend outpatient PFTs  Hypertension Patient is normotensive at this time. Anti-hypertensive regimen is bisoprolol-HCTZ 2.5-6.25 mg daily. -Restart home regimen as needed  Depression Insomnia Patient is on quetiapine 25 mg daily at bedtime PRN sleep; he has had minimal sleep over the last 2 weeks due to his cough. He is also on citalopram 20 mg daily. -Continue quetiapine 25 mg daily at bedtime PRN sleep -Continue citalopram 20 mg daily  HLD -Continue home atorvastatin 20 mg daily  Elevated bilirubin Patient with history of significant alcohol use though he denies recent consumption. Total, indirect, and direct bilirubin are elevated. Does not complain of RUQ pain. Hgb is normal and no overt signs of bleeding or hemolysis. Differential includes alcohol pancreatitis (less  likely if no recent consumption), hemolysis. Remainder of liver enzymes are WNL. -Recheck CMP tomorrow morning; consider also checking ALP and RUQ ultrasound  Dispo: Admit patient to Observation with expected length of stay less than 2 midnights.  Signed: Champ Mungo, DO Internal Medicine Resident, PGY-2 [facility]  Pager: [phone]

## 2022-11-02 NOTE — ED Triage Notes (Signed)
Arrives for nonproductive cough, SOB, constipation, N/V, poor appetite that has been getting worse for the past week since being diagnosed with the flu. H/o COPD. RA at baseline.

## 2022-11-03 DIAGNOSIS — J180 Bronchopneumonia, unspecified organism: Secondary | ICD-10-CM

## 2022-11-03 DIAGNOSIS — J9601 Acute respiratory failure with hypoxia: Secondary | ICD-10-CM | POA: Diagnosis not present

## 2022-11-03 DIAGNOSIS — J441 Chronic obstructive pulmonary disease with (acute) exacerbation: Secondary | ICD-10-CM

## 2022-11-03 DIAGNOSIS — F17210 Nicotine dependence, cigarettes, uncomplicated: Secondary | ICD-10-CM | POA: Diagnosis not present

## 2022-11-03 LAB — CBC
HCT: 40.2 % (ref 39.0–52.0)
Hemoglobin: 14.1 g/dL (ref 13.0–17.0)
MCH: 33.8 pg (ref 26.0–34.0)
MCHC: 35.1 g/dL (ref 30.0–36.0)
MCV: 96.4 fL (ref 80.0–100.0)
Platelets: 289 10*3/uL (ref 150–400)
RBC: 4.17 MIL/uL — ABNORMAL LOW (ref 4.22–5.81)
RDW: 12.3 % (ref 11.5–15.5)
WBC: 6.9 10*3/uL (ref 4.0–10.5)
nRBC: 0 % (ref 0.0–0.2)

## 2022-11-03 LAB — COMPREHENSIVE METABOLIC PANEL
ALT: 29 U/L (ref 0–44)
AST: 17 U/L (ref 15–41)
Albumin: 2.9 g/dL — ABNORMAL LOW (ref 3.5–5.0)
Alkaline Phosphatase: 50 U/L (ref 38–126)
Anion gap: 9 (ref 5–15)
BUN: 11 mg/dL (ref 8–23)
CO2: 27 mmol/L (ref 22–32)
Calcium: 8.7 mg/dL — ABNORMAL LOW (ref 8.9–10.3)
Chloride: 106 mmol/L (ref 98–111)
Creatinine, Ser: 0.89 mg/dL (ref 0.61–1.24)
GFR, Estimated: >60 mL/min (ref >60)
Glucose, Bld: 120 mg/dL — ABNORMAL HIGH (ref 70–99)
Potassium: 3 mmol/L — ABNORMAL LOW (ref 3.5–5.1)
Sodium: 142 mmol/L (ref 135–145)
Total Bilirubin: 1.1 mg/dL (ref 0.3–1.2)
Total Protein: 5.8 g/dL — ABNORMAL LOW (ref 6.5–8.1)

## 2022-11-03 MED ORDER — BISOPROLOL-HYDROCHLOROTHIAZIDE 2.5-6.25 MG PO TABS: 1.0000 | ORAL_TABLET | Freq: Every morning | ORAL | Status: DC

## 2022-11-03 MED ORDER — AMOXICILLIN-POT CLAVULANATE 875-125 MG PO TABS: 1.0000 | ORAL_TABLET | Freq: Two times a day (BID) | ORAL | Status: DC

## 2022-11-03 MED ORDER — POTASSIUM CHLORIDE CRYS ER 20 MEQ PO TBCR
40.0000 meq | EXTENDED_RELEASE_TABLET | Freq: Two times a day (BID) | ORAL | Status: AC
  Administered 2022-11-03 (×2): 40 meq via ORAL
  Filled 2022-11-03 (×2): qty 2

## 2022-11-03 MED ORDER — BISOPROLOL FUMARATE 5 MG PO TABS
2.5000 mg | ORAL_TABLET | Freq: Every day | ORAL | Status: DC
  Administered 2022-11-03 – 2022-11-04 (×2): 2.5 mg via ORAL
  Filled 2022-11-03 (×2): qty 0.5

## 2022-11-03 MED ORDER — HYDROCHLOROTHIAZIDE 12.5 MG PO TABS
6.2500 mg | ORAL_TABLET | Freq: Every day | ORAL | Status: DC
  Administered 2022-11-03 – 2022-11-04 (×2): 6.25 mg via ORAL
  Filled 2022-11-03 (×2): qty 1

## 2022-11-03 MED ORDER — ENOXAPARIN SODIUM 60 MG/0.6ML IJ SOSY
55.0000 mg | PREFILLED_SYRINGE | INTRAMUSCULAR | Status: DC
  Administered 2022-11-03 – 2022-11-04 (×2): 55 mg via SUBCUTANEOUS
  Filled 2022-11-03 (×2): qty 0.6

## 2022-11-03 MED ORDER — AMOXICILLIN-POT CLAVULANATE 875-125 MG PO TABS
1.0000 | ORAL_TABLET | Freq: Two times a day (BID) | ORAL | Status: DC
  Administered 2022-11-04: 1 via ORAL
  Filled 2022-11-03: qty 1

## 2022-11-03 NOTE — Progress Notes (Signed)
SATURATION QUALIFICATIONS: (This note is used to comply with regulatory documentation for home oxygen)  Patient Saturations on Room Air at Rest = 92%  Patient Saturations on Room Air while Ambulating = 86%  Patient Saturations on 3L Liters of oxygen while Ambulating = 89%  Please briefly explain why patient needs home oxygen:  Patient unable to maintain O2 sats without supplemental O2, with increased SOB noted.   11/03/2022  RP, OTR/L  Acute Rehabilitation Services  Office:  [phone]

## 2022-11-03 NOTE — Progress Notes (Signed)
Internal Medicine Attending:   I saw and examined the patient. I reviewed the resident's H&P note and I agree with the resident's findings and plan as documented in the resident's note.  In brief, patient is 66 year old male with a past medical history of OSA, COPD, hypertension, depression and insomnia presented to the ED with worsening shortness of breath over the last 2 weeks.  Patient was recently diagnosed with influenza on current steroids as an outpatient but stopped taking them secondary to his anxiety.  In the ED, he was noted to be hypoxic and required 2 L O2 via nasal cannula on presentation.  Imaging done in the ED showed severe bronchitis and possible developing multilobar bilateral bronchopneumonia.  He was admitted for further evaluation.  Today, patient states that he still has some persistent shortness of breath and difficulty ambulating secondary to this.   Vitals:   11/03/22 0517 11/03/22 0718  BP: (!) 130/90 125/75  Pulse: 90 91  Resp: 17 15  Temp: 98 F (36.7 C) 98 F (36.7 C)  SpO2: 95% 95%    On exam, patient is lying in bed in no apparent distress.  Lungs are clear to auscultation bilaterally with no wheezes or rails appreciated.  Cardiovascular exam reveals regular rate and rhythm with normal heart sounds.  Abdomen is soft, nontender, nondistended with normoactive bowel sounds.  Lower extremities are nontender to palpation with no edema noted.  Patient's mood and affect are normal and he is oriented x 3.  Assessment and plan:  1.  Acute hypoxic respiratory failure likely secondary to bronchopneumonia: -Patient presented to ED with progressively worsening shortness of breath over the last couple weeks in the setting of her recent diagnosis of influenza and was noted to have severe bronchitis and developing multilobar bilateral bronchopneumonia on imaging.  I suspect that the patient's symptoms are likely secondary to a viral/postviral pneumonia.  Patient initially had  leukocytosis up to 15.7 which is since resolved but procalcitonin was less than 0.10 which would argue against a bacterial infection. -Patient refuses further prednisone as weeks as that it causes anxiety -His lungs are clear to auscultation today and does not believe that he needs further steroids at this time.  Will DC prednisone -Patient was started on azithromycin but does have a prolonged QTc on his EKG.  Even though his procalcitonin is less than 0.10 given his imaging findings as well as leukocytosis on admission I am inclined to complete a 5-day course of Augmentin for him.  Will transition him to Augmentin to complete a 5-day course -Continue with an oral Ellipta for now -Patient still with hypoxia on ambulation with O2 sats down to 86% when he ambulates on room air.  Will continue with O2 via nasal cannula for now and attempt to wean him down slowly -OT follow-up appreciated.  No further OT required -Will continue to monitor patient closely.  If he continues to remain stable may be able to go home tomorrow

## 2022-11-03 NOTE — Evaluation (Signed)
Occupational Therapy Evaluation Patient Details Name: Keith Cordova MRN: [id number] DOB: 1956-09-05 Today's Date: 11/03/2022   History of Present Illness 66 yo male presents to [facility] on 12/27 with nonproductive cough, ShOB, constipation, N/v, poor appetite for the past week since recent dx of flu. Pt with multilobar pna. PMH includes COPD, depression, HTN, HLD.   Clinical Impression   Patient admitted for the diagnosis above.  PTA he lives at home, and is independent with ADL,iADL and mobility.  Unsteadiness and SOB are the deficits.  Currently he is needing up to [facility] for in room mobility, and supervision for ADL completion.  Acute OT is indicated to maximize his functional status for an eventual return home.  HH OT could be considered depending on progress.         Recommendations for follow up therapy are one component of a multi-disciplinary discharge planning process, led by the attending physician.  Recommendations may be updated based on patient status, additional functional criteria and insurance authorization.   Follow Up Recommendations  No OT follow up     Assistance Recommended at Discharge Set up Supervision/Assistance  Patient can return home with the following A little help with walking and/or transfers;A little help with bathing/dressing/bathroom;Assist for transportation    Functional Status Assessment  Patient has had a recent decline in their functional status and demonstrates the ability to make significant improvements in function in a reasonable and predictable amount of time.  Equipment Recommendations  None recommended by OT    Recommendations for Other Services       Precautions / Restrictions Precautions Precautions: Fall Precaution Comments: watch O2 Restrictions Weight Bearing Restrictions: No      Mobility Bed Mobility Overal bed mobility: Needs Assistance Bed Mobility: Supine to Sit     Supine to sit: Modified independent (Device/Increase  time)          Transfers Overall transfer level: Needs assistance Equipment used: None Transfers: Sit to/from Stand Sit to Stand: Supervision                  Balance Overall balance assessment: Needs assistance Sitting-balance support: No upper extremity supported, Feet supported Sitting balance-Leahy Scale: Good     Standing balance support: No upper extremity supported Standing balance-Leahy Scale: Fair                             ADL either performed or assessed with clinical judgement   ADL       Grooming: Wash/dry face;Supervision/safety;Standing               Lower Body Dressing: Supervision/safety;Sit to/from stand   Toilet Transfer: Solicitor;Ambulation                   Vision Patient Visual Report: No change from baseline       Perception     Praxis      Pertinent Vitals/Pain Pain Assessment Pain Assessment: No/denies pain     Hand Dominance Right   Extremity/Trunk Assessment Upper Extremity Assessment Upper Extremity Assessment: Overall WFL for tasks assessed   Lower Extremity Assessment Lower Extremity Assessment: Defer to PT evaluation   Cervical / Trunk Assessment Cervical / Trunk Assessment: Normal   Communication Communication Communication: No difficulties   Cognition Arousal/Alertness: Awake/alert Behavior During Therapy: Flat affect Overall Cognitive Status: Within Functional Limits for tasks assessed  General Comments: slow to respond, fatigued.     General Comments   VSS on supplemental O2    Exercises     Shoulder Instructions      Home Living Family/patient expects to be discharged to:: Private residence Living Arrangements: Spouse/significant other;Children Available Help at Discharge: Family Type of Home: House Home Access: Stairs to enter [employer] of Steps: 1   Home Layout: Multi-level;Able to live on  main level with bedroom/bathroom     Bathroom Shower/Tub: Tub/shower unit;Walk-in shower   Bathroom Toilet: Standard Bathroom Accessibility: Yes How Accessible: Accessible via walker Home Equipment: [place] - single point          Prior Functioning/Environment Prior Level of Function : Independent/Modified Independent                        OT Problem List: Decreased activity tolerance;Impaired balance (sitting and/or standing);Decreased strength      OT Treatment/Interventions: Self-care/ADL training;Therapeutic activities;Patient/family education;Balance training    OT Goals(Current goals can be found in the care plan section) Acute Rehab OT Goals Patient Stated Goal: Return home OT Goal Formulation: With patient Time For Goal Achievement: 11/17/22 Potential to Achieve Goals: Good  OT Frequency: Min 2X/week    Co-evaluation              AM-PAC OT "6 Clicks" Daily Activity     Outcome Measure Help from another person eating meals?: None Help from another person taking care of personal grooming?: A Little Help from another person toileting, which includes using toliet, bedpan, or urinal?: A Little Help from another person bathing (including washing, rinsing, drying)?: A Little Help from another person to put on and taking off regular upper body clothing?: None Help from another person to put on and taking off regular lower body clothing?: A Little 6 Click Score: 20   End of Session Equipment Utilized During Treatment: Oxygen Nurse Communication: Mobility status  Activity Tolerance: Patient tolerated treatment well Patient left: in chair;with call bell/phone within reach  OT Visit Diagnosis: Unsteadiness on feet (R26.81)                Time: [id number] OT Time Calculation (min): 16 min Charges:  OT General Charges $OT Visit: 1 Visit OT Evaluation $OT Eval Moderate Complexity: 1 Mod  11/03/2022  RP, OTR/L  Acute Rehabilitation Services  Office:   [phone]   Metta Clines 11/03/2022, 8:43 AM

## 2022-11-03 NOTE — Outcomes Assessment (Signed)
Attempted to obtain ambulating o2. Patient states he feels weak with SHOB. PT assisted patient with transfer from bed to chair with reported weakness and SHOB.  o2 sat 95% on 3L-[place], patient grunting. Will attempt again later in the day. Patient sitting in recliner, call bell within reach.

## 2022-11-03 NOTE — Progress Notes (Addendum)
HD#0 Subjective:   Summary: This is a 66 year old male with past medical history of OSA, COPD, hypertension, depression who presents to the emergency room with concerns of dyspnea for the past 2 weeks.  Patient admitted for bronchopneumonia.  Overnight Events: No overnight events  Patient states that he is still having some shortness of breath with ambulation and moving.  He denies any shortness of breath at rest.  He denies any worsening cough.  He does state that he does have a cough, but does not bring anything up.  He denies any chest pain or any other concerns today.  He states he is eating and drinking well.  He has a primary care physician in [place].  Objective:  Vital signs in last 24 hours: Vitals:   11/02/22 1056 11/02/22 1643 11/02/22 2057 11/03/22 0517  BP: 98/69 (!) 120/99 (!) 127/114 (!) 130/90  Pulse: (!) 106 99 92 90  Resp: 19 18 19 17   Temp: 98.9 F (37.2 C) 98.2 F (36.8 C) 97.6 F (36.4 C) 98 F (36.7 C)  TempSrc: Oral Oral Oral   SpO2:  95% 96% 95%  Weight: 111 kg     Height: 5\' 10"  (1.778 m)      Supplemental O2: Nasal Cannula SpO2: 95 % O2 Flow Rate (L/min): 3 L/min   Physical Exam:  Constitutional: Resting in bed in no acute distress HENT: normocephalic atraumatic, mucous membranes moist Eyes: conjunctiva non-erythematous Neck: supple Cardiovascular: regular rate and rhythm, no m/r/g Pulmonary/Chest: Increased work of breathing noted on exam.  No wheezing. No Rhonchi or rales appreciated.  Patient does have nasal cannula in place.  Filed Weights   11/02/22 0418 11/02/22 1056  Weight: 111 kg 111 kg     Intake/Output Summary (Last 24 hours) at 11/03/2022 0550 Last data filed at 11/03/2022 0450 Gross per 24 hour  Intake 100 ml  Output 1 ml  Net 99 ml   Net IO Since Admission: 99 mL [11/03/22 0550]  Pertinent Labs:    Latest Ref Rng & Units 11/03/2022    2:12 AM 11/02/2022    4:29 AM 10/22/2022    5:30 PM  CBC  WBC 4.0 - 10.5  K/uL 6.9  15.7  6.2   Hemoglobin 13.0 - 17.0 g/dL 11/04/2022  10/24/2022  50.5   Hematocrit 39.0 - 52.0 % 40.2  42.4  41.8   Platelets 150 - 400 K/uL 289  324  236        Latest Ref Rng & Units 11/03/2022    2:12 AM 11/02/2022    5:20 AM 11/02/2022    4:29 AM  CMP  Glucose 70 - 99 mg/dL 11/04/2022   11/04/2022   BUN 8 - 23 mg/dL 11   9   Creatinine 419 - 1.24 mg/dL 379   0.24   Sodium 0.97 - 145 mmol/L 142   136   Potassium 3.5 - 5.1 mmol/L 3.0   3.0   Chloride 98 - 111 mmol/L 106   104   CO2 22 - 32 mmol/L 27   22   Calcium 8.9 - 10.3 mg/dL 8.7   8.8   Total Protein 6.5 - 8.1 g/dL 5.8  6.2    Total Bilirubin 0.3 - 1.2 mg/dL 1.1  1.8    Alkaline Phos 38 - 126 U/L 50  51    AST 15 - 41 U/L 17  29    ALT 0 - 44 U/L 29  36  Imaging: CT Angio Chest PE W/Cm &/Or Wo Cm  Result Date: 11/02/2022 CLINICAL DATA:  66 year old male with history of chest pain and shortness of breath. EXAM: CT ANGIOGRAPHY CHEST WITH CONTRAST TECHNIQUE: Multidetector CT imaging of the chest was performed using the standard protocol during bolus administration of intravenous contrast. Multiplanar CT image reconstructions and MIPs were obtained to evaluate the vascular anatomy. RADIATION DOSE REDUCTION: This exam was performed according to the departmental dose-optimization program which includes automated exposure control, adjustment of the mA and/or kV according to patient size and/or use of iterative reconstruction technique. CONTRAST:  54mL OMNIPAQUE IOHEXOL 350 MG/ML SOLN COMPARISON:  No priors. FINDINGS: Cardiovascular: No filling defects within the pulmonary arterial tree to suggest pulmonary embolism. Heart size is normal. There is no significant pericardial fluid, thickening or pericardial calcification. There is aortic atherosclerosis, as well as atherosclerosis of the great vessels of the mediastinum and the coronary arteries, including calcified atherosclerotic plaque in the left anterior descending and right coronary arteries.  Mediastinum/Nodes: No pathologically enlarged mediastinal or hilar lymph nodes. Several prominent but nonenlarged mediastinal and hilar lymph nodes are incidentally noted. Esophagus is unremarkable in appearance. No axillary lymphadenopathy. Lungs/Pleura: Diffuse bronchial wall thickening, thickening of the peribronchovascular interstitium and patchy areas of peribronchovascular ground-glass attenuation micro and macronodularity, along with some scattered peribronchovascular consolidative changes, most severe in the medial left lower lobe, and posterior right upper lobe. No pleural effusions. No other larger more suspicious appearing pulmonary nodules or masses are noted. Upper Abdomen: Atherosclerotic calcifications in the abdominal aorta. Musculoskeletal: There are no aggressive appearing lytic or blastic lesions noted in the visualized portions of the skeleton. Review of the MIP images confirms the above findings. IMPRESSION: 1. No evidence of pulmonary embolism. 2. The appearance of the lungs is most suggestive of severe bronchitis with developing multilobar bilateral bronchopneumonia, as above. 3. Aortic atherosclerosis, in addition to 2 vessel coronary artery disease. Please note that although the presence of coronary artery calcium documents the presence of coronary artery disease, the severity of this disease and any potential stenosis cannot be assessed on this non-gated CT examination. Assessment for potential risk factor modification, dietary therapy or pharmacologic therapy may be warranted, if clinically indicated. Aortic Atherosclerosis (ICD10-I70.0). Electronically Signed   By: Trudie Reed M.D.   On: 11/02/2022 07:08    Assessment/Plan:   Principal Problem:   Acute respiratory failure with hypoxia [facility])   Patient Summary: Keith Cordova is a 66 y.o. male with past medical history of OSA, COPD, hypertension, depression who presents to the emergency room with concerns of dyspnea for the  past 2 weeks.  Patient admitted for bronchopneumonia.  #Acute hypoxic respiratory failure likely secondary to bronchopneumonia #COPD exacerbation Patient improving slowly, with shortness of breath improved at rest.  Patient still having concerns about shortness of breath with ambulation.  Patient denies any worsening cough or production with his cough.  He denies any fevers, does report some chills.  He states he is doing better, but feels that he is not back to his normal self.  He does endorse a sick contact in his wife.  On exam, patient not wheezing, but there is some increased work of breathing.  Patient does endorse that he does not want any more steroids as they do not make him feel well. -Discontinue steroids -Switch azithromycin to Augmentin -Continue Anoro Ellipta -DuoNebs as needed -Wean oxygen as tolerated to keep SpO2 between 88% to 92% -PT OT following  #Hypokalemia Slightly decreased potassium today. -Replete  and recheck in a.m.  #Hypertension Patient blood pressure measuring well during hospitalization. -Resume home bisoprolol-HCTZ 2.5-6.25 mg tablet daily  #Insomnia #Depression -Continue quetiapine 25 mg daily as needed -Continue citalopram 20 mg daily  #HLD Chronic. -Continue home atorvastatin 20 mg daily  #Elevated bilirubin -Resolved  Diet: Normal IVF: None,None VTE: Enoxaparin Code: DNR PT/OT recs: None  Dispo: Anticipated discharge to Home in 2 days pending clinical improvement.   Modena Slater DO Internal Medicine Resident PGY-1 [phone] Please contact the on call pager after 5 pm and on weekends at [phone].

## 2022-11-04 ENCOUNTER — Other Ambulatory Visit (HOSPITAL_COMMUNITY): Payer: Self-pay

## 2022-11-04 LAB — CBC
HCT: 40.7 % (ref 39.0–52.0)
Hemoglobin: 13.6 g/dL (ref 13.0–17.0)
MCH: 32.5 pg (ref 26.0–34.0)
MCHC: 33.4 g/dL (ref 30.0–36.0)
MCV: 97.1 fL (ref 80.0–100.0)
Platelets: 331 10*3/uL (ref 150–400)
RBC: 4.19 MIL/uL — ABNORMAL LOW (ref 4.22–5.81)
RDW: 12.2 % (ref 11.5–15.5)
WBC: 6.4 10*3/uL (ref 4.0–10.5)
nRBC: 0 % (ref 0.0–0.2)

## 2022-11-04 LAB — BASIC METABOLIC PANEL
Anion gap: 5 (ref 5–15)
BUN: 13 mg/dL (ref 8–23)
CO2: 28 mmol/L (ref 22–32)
Calcium: 8.9 mg/dL (ref 8.9–10.3)
Chloride: 108 mmol/L (ref 98–111)
Creatinine, Ser: 0.86 mg/dL (ref 0.61–1.24)
GFR, Estimated: >60 mL/min (ref >60)
Glucose, Bld: 116 mg/dL — ABNORMAL HIGH (ref 70–99)
Potassium: 3.7 mmol/L (ref 3.5–5.1)
Sodium: 141 mmol/L (ref 135–145)

## 2022-11-04 MED ORDER — POLYETHYLENE GLYCOL 3350 17 G PO PACK
17.0000 g | PACK | Freq: Every day | ORAL | Status: DC
  Administered 2022-11-04: 17 g via ORAL
  Filled 2022-11-04: qty 1

## 2022-11-04 MED ORDER — UMECLIDINIUM-VILANTEROL 62.5-25 MCG/ACT IN AEPB
1.0000 | INHALATION_SPRAY | Freq: Every day | RESPIRATORY_TRACT | 0 refills | Status: AC
  Filled 2022-11-04: qty 60, 30d supply, fill #0
  Filled 2022-11-04: qty 60, 60d supply, fill #0

## 2022-11-04 MED ORDER — AMOXICILLIN-POT CLAVULANATE 875-125 MG PO TABS
1.0000 | ORAL_TABLET | Freq: Two times a day (BID) | ORAL | 0 refills | Status: AC
  Filled 2022-11-04: qty 5, 3d supply, fill #0

## 2022-11-04 NOTE — Discharge Instructions (Addendum)
Mr. Keith Cordova,  It was a pleasure taking care of you at [facility]. You were admitted for acute respiratory failure, and treated with oxygen and inhalers.  We are discharging you home now that you are doing better. Please follow the following instructions.   1) Regarding your pneumonia, we are sending you home on antibiotics.  Please finish this course of antibiotics. The name of the antibiotic is Augmentin. You will need to take 5 more tablets to finish the course over the next 2 days.  You will take 1 tablet this evening, 2 tablets the next day, and 2 tablets to finish it off the following day to finish on 11/06/2022.  We are also sending you home on Anoro Ellipta which is an inhaler to prevent COPD exacerbations. Please take this as directed every day.  2) Please call your PCP to be seen next week for hospital follow up. At that the time of the appointment, please ask your PCP for a chest X ray to ensure resolution of the Xray findings.  3) If you develop worsening shortness of breath, develop fevers or chills, please return to the emergency room to be evaluated.   4) Please take all of your medications as prescribed and bring a list of your medications to the next office appointment you have. You should have an updated list on the discharge packet.   Take care,  Dr. Modena Slater, DO

## 2022-11-04 NOTE — Discharge Summary (Signed)
Name: Keith Cordova MRN: [id number] DOB: June 29, 1956 66 y.o. PCP: Judith Part, MD  Date of Admission: 11/02/2022  4:09 AM Date of Discharge: 11/04/2022 Attending Physician: Dr. Mercie Eon   Discharge Diagnosis: Principal Problem:   Acute respiratory failure with hypoxia [facility])    Discharge Medications: Allergies as of 11/04/2022       Reactions   Morphine Rash   Prednisone Anxiety        Medication List     STOP taking these medications    predniSONE 10 MG (21) Tbpk tablet Commonly known as: STERAPRED UNI-PAK 21 TAB       TAKE these medications    amoxicillin-clavulanate 875-125 MG tablet Commonly known as: AUGMENTIN Take 1 tablet by mouth every 12 (twelve) hours for 5 doses.   atorvastatin 20 MG tablet Commonly known as: LIPITOR Take 1 tablet (20 mg total) by mouth daily for cholesterol   B-12 1000 MCG Subl Place 1 tablet under the tongue daily.   bisoprolol-hydrochlorothiazide 2.5-6.25 MG tablet Commonly known as: ZIAC Take 1 tablet by mouth in the morning for blood pressure   citalopram 20 MG tablet Commonly known as: CELEXA Take 1 tablet (20 mg total) by mouth in the morning for depression/anxiety   mupirocin ointment 2 % Commonly known as: BACTROBAN Apply 1 application topically 2 (two) times daily as needed for skin infections.   QUEtiapine 25 MG tablet Commonly known as: SEROQUEL Take 25 mg by mouth at bedtime as needed (for sleep).   umeclidinium-vilanterol 62.5-25 MCG/ACT Aepb Commonly known as: ANORO ELLIPTA Inhale 1 puff into the lungs daily. Start taking on: November 05, 2022        Disposition and follow-up:   Mr.Keith Cordova was discharged from [facility] in Stable condition.  At the hospital follow up visit please address:  1.  Follow-up:  a.  Hypoxic respiratory failure likely secondary to postviral pneumonia/COPD exacerbation: Patient respiratory status back to baseline.  Patient to complete  Augmentin outpatient.  Patient to start taking Anoro Ellipta outpatient as well.  Ensure patient's respiratory status remained stable. At the follow up, please schedule patient for Chest X-ray to ensure resolution of pneumonia and to rule out underlying malignancy in the middle of January 2024.     b.  Hypokalemia: Patient had decreased potassium during hospitalization.  It was supplemented in the hospital.  Ensure patient's potassium remains normal in the outpatient setting.  2.  Labs / imaging needed at time of follow-up: CXR in 3 weeks, BMP  3.  Pending labs/ test needing follow-up: N/A  4.  Medication Changes  1) Patient started on Anoro Ellipta on Discharge   2) Patient to complete course of Augmentin on discharge.   Follow-up Appointments:  Follow-up Information     Judith Part, MD. Call today.   Specialty: Nephrology Why: Please call today to make appointment to be seen for hospital follow up. Contact information: [address] [place] [address] [phone]               Patient to follow-up with PCP in 1 week.  Hospital Course by problem list: Keith Cordova is a 66 y.o. male with past medical history of OSA, COPD, hypertension, depression who presents to the emergency room with concerns of dyspnea for the past 2 weeks.  Patient admitted for bronchopneumonia.   #Acute hypoxic respiratory failure likely secondary to postviral pneumonia #COPD exacerbation Patient initially came to the emergency room with concerns of 2-week history of  shortness of breath.  Patient was recovering from a viral illness, which was likely contracted from his partner who was also sick.  Patient states he had worsening cough and shortness of breath with ambulation.  Initial chest x-ray was concerning for possible bronchopneumonia.  Patient was initially started on systemic steroids as well as azithromycin for COPD exacerbation.  Patient was also started on Anoro Ellipta.  During  hospitalization, patient's respiratory status improved, patient was taken off of oxygen, and transition to room air.  Steroids were discontinued, as patient cannot tolerate them.  Patient was also switched from azithromycin to Augmentin, to complete CAP coverage.  Patient to be discharged with Anoro Ellipta.  Patient also to be discharged with course of Augmentin to finish.  Patient will be followed up with home health PT.  Patient also states he will follow-up with his PCP outpatient.   #Hypokalemia Patient did have decreased potassium during hospitalization, and it was supplemented.   #Hypertension Patient was continued on home bisoprolol-HCTZ 2.5-6.25 mg tablet daily during hospitalization.   #Insomnia #Depression Patient was continued on home quetiapine 25 mg daily as needed and citalopram 20 mg daily.   #HLD Chronic.  Patient was continued on home atorvastatin 20 mg daily during hospitalization.   #Elevated bilirubin Patient initial he had an elevated bilirubin on admission.  It resolved the next day.  No concerns during hospitalization.   Discharge Subjective: Patient reports having some nausea this morning.  He also reports having constipation.  He states that he feels a soreness in the stomach.  He states that over the past 4 weeks, he has not had a proper bowel movement.  He states that he had 2 small bowel movements yesterday which were hard.  He reports good appetite.  He denies any shortness of breath or chest pain.  He states he is ready to go home.  Discharge Exam:   BP 125/79 (BP Location: Right Arm)   Pulse 81   Temp 97.8 F (36.6 C) (Oral)   Resp 15   Ht 5\' 10"  (1.778 m)   Wt 111 kg   SpO2 96%   BMI 35.11 kg/m  Constitutional: Resting in bed in no acute distress HENT: normocephalic atraumatic, mucous membranes moist Eyes: conjunctiva non-erythematous Neck: supple Cardiovascular: regular rate and rhythm, no m/r/g Pulmonary/Chest: Increased work of breathing noted  on exam.  No wheezing. No Rhonchi or rales appreciated.  Satting well on room air.  Pertinent Labs, Studies, and Procedures:     Latest Ref Rng & Units 11/04/2022    2:53 AM 11/03/2022    2:12 AM 11/02/2022    4:29 AM  CBC  WBC 4.0 - 10.5 K/uL 6.4  6.9  15.7   Hemoglobin 13.0 - 17.0 g/dL 11/04/2022  29.5  28.4   Hematocrit 39.0 - 52.0 % 40.7  40.2  42.4   Platelets 150 - 400 K/uL 331  289  324        Latest Ref Rng & Units 11/04/2022    2:53 AM 11/03/2022    2:12 AM 11/02/2022    5:20 AM  CMP  Glucose 70 - 99 mg/dL 11/04/2022  440    BUN 8 - 23 mg/dL 13  11    Creatinine 102 - 1.24 mg/dL 7.25  3.66    Sodium 4.40 - 145 mmol/L 141  142    Potassium 3.5 - 5.1 mmol/L 3.7  3.0    Chloride 98 - 111 mmol/L 108  106  CO2 22 - 32 mmol/L 28  27    Calcium 8.9 - 10.3 mg/dL 8.9  8.7    Total Protein 6.5 - 8.1 g/dL  5.8  6.2   Total Bilirubin 0.3 - 1.2 mg/dL  1.1  1.8   Alkaline Phos 38 - 126 U/L  50  51   AST 15 - 41 U/L  17  29   ALT 0 - 44 U/L  29  36     CT Angio Chest PE W/Cm &/Or Wo Cm  Result Date: 11/02/2022 CLINICAL DATA:  66 year old male with history of chest pain and shortness of breath. EXAM: CT ANGIOGRAPHY CHEST WITH CONTRAST TECHNIQUE: Multidetector CT imaging of the chest was performed using the standard protocol during bolus administration of intravenous contrast. Multiplanar CT image reconstructions and MIPs were obtained to evaluate the vascular anatomy. RADIATION DOSE REDUCTION: This exam was performed according to the departmental dose-optimization program which includes automated exposure control, adjustment of the mA and/or kV according to patient size and/or use of iterative reconstruction technique. CONTRAST:  50mL OMNIPAQUE IOHEXOL 350 MG/ML SOLN COMPARISON:  No priors. FINDINGS: Cardiovascular: No filling defects within the pulmonary arterial tree to suggest pulmonary embolism. Heart size is normal. There is no significant pericardial fluid, thickening or pericardial  calcification. There is aortic atherosclerosis, as well as atherosclerosis of the great vessels of the mediastinum and the coronary arteries, including calcified atherosclerotic plaque in the left anterior descending and right coronary arteries. Mediastinum/Nodes: No pathologically enlarged mediastinal or hilar lymph nodes. Several prominent but nonenlarged mediastinal and hilar lymph nodes are incidentally noted. Esophagus is unremarkable in appearance. No axillary lymphadenopathy. Lungs/Pleura: Diffuse bronchial wall thickening, thickening of the peribronchovascular interstitium and patchy areas of peribronchovascular ground-glass attenuation micro and macronodularity, along with some scattered peribronchovascular consolidative changes, most severe in the medial left lower lobe, and posterior right upper lobe. No pleural effusions. No other larger more suspicious appearing pulmonary nodules or masses are noted. Upper Abdomen: Atherosclerotic calcifications in the abdominal aorta. Musculoskeletal: There are no aggressive appearing lytic or blastic lesions noted in the visualized portions of the skeleton. Review of the MIP images confirms the above findings. IMPRESSION: 1. No evidence of pulmonary embolism. 2. The appearance of the lungs is most suggestive of severe bronchitis with developing multilobar bilateral bronchopneumonia, as above. 3. Aortic atherosclerosis, in addition to 2 vessel coronary artery disease. Please note that although the presence of coronary artery calcium documents the presence of coronary artery disease, the severity of this disease and any potential stenosis cannot be assessed on this non-gated CT examination. Assessment for potential risk factor modification, dietary therapy or pharmacologic therapy may be warranted, if clinically indicated. Aortic Atherosclerosis (ICD10-I70.0). Electronically Signed   By: Trudie Reed M.D.   On: 11/02/2022 07:08   DG Chest 2 View  Result Date:  11/02/2022 CLINICAL DATA:  66 year old male with history of shortness of breath and chest pain. EXAM: CHEST - 2 VIEW COMPARISON:  Chest x-ray 10/22/2022. FINDINGS: Lung volumes are normal. No consolidative airspace disease. However, there is diffuse interstitial prominence and widespread peribronchial cuffing throughout the lungs bilaterally, most severe in the right lower lung. No pleural effusions. No pneumothorax. No pulmonary nodule or mass noted. Pulmonary vasculature and the cardiomediastinal silhouette are within normal limits. Atherosclerosis in the thoracic aorta. IMPRESSION: 1. Findings are concerning for an acute bronchitis, potentially with developing bronchopneumonia in the right lung base. Followup PA and lateral chest X-ray is recommended in 3-4 weeks following trial of  antibiotic therapy to ensure resolution and exclude underlying malignancy. 2. Aortic atherosclerosis. Electronically Signed   By: Trudie Reedaniel  Entrikin M.D.   On: 11/02/2022 05:41     Discharge Instructions: Discharge Instructions     Call MD for:   Complete by: As directed    Call MD for:  difficulty breathing, headache or visual disturbances   Complete by: As directed    Call MD for:  persistant dizziness or light-headedness   Complete by: As directed    Call MD for:  temperature >100.4   Complete by: As directed    Diet - low sodium heart healthy   Complete by: As directed    Increase activity slowly   Complete by: As directed       Mr. Claybon JabsZimmie Feltman,  [facility]. You were admitted for acute respiratory failure, and treated with oxygen and inhalers.  We are discharging you home now that you are doing better. Please follow the following instructions.   1) Regarding your pneumonia, we are sending you home on antibiotics.  Please finish this course of antibiotics. The name of the antibiotic is Augmentin. You will need to take 5 more tablets to finish the course over  the next 2 days.  You will take 1 tablet this evening, 2 tablets the next day, and 2 tablets to finish it off the following day to finish on 11/06/2022.  We are also sending you home on Anoro Ellipta which is an inhaler to prevent COPD exacerbations. Please take this as directed every day.  2) Please call your PCP to be seen next week for hospital follow up.   3) If you develop worsening shortness of breath, develop fevers or chills, please return to the emergency room to be evaluated.   4) Please take all of your medications as prescribed and bring a list of your medications to the next office appointment you have. You should have an updated list on the discharge packet.   Take care,  Dr. Modena SlaterAmar Carolynn Tuley, DO   Signed: Modena Slateratel, Constantinos Krempasky, DO 11/04/2022, 1:06 PM   Pager: [phone]

## 2022-11-04 NOTE — Progress Notes (Signed)
Physical Therapy Treatment Patient Details Name: Keith Cordova MRN: [id number] DOB: 14-Aug-1956 Today's Date: 11/04/2022   History of Present Illness 66 yo male presents to [facility] on 12/27 with nonproductive cough, ShOB, constipation, N/v, poor appetite for the past week since recent dx of flu. Pt with multilobar pna. PMH includes COPD, depression, HTN, HLD.    PT Comments    Pt admitted with above diagnosis. Pt was able to ambulate and incr distance.  Did not need O2 as sats on RA with acitivty >90%.  It appears that pt was unsteady with gait at baseline therefore would benefit from use of rollator for stability as well as endurance and HHPT for balance training. Pt currently with functional limitations due to balance and endurance deficits. Pt will benefit from skilled PT to increase their independence and safety with mobility to allow discharge to the venue listed below.      Recommendations for follow up therapy are one component of a multi-disciplinary discharge planning process, led by the attending physician.  Recommendations may be updated based on patient status, additional functional criteria and insurance authorization.  Follow Up Recommendations  Home health PT     Assistance Recommended at Discharge Set up Supervision/Assistance  Patient can return home with the following A little help with walking and/or transfers;A little help with bathing/dressing/bathroom   Equipment Recommendations  Rollator (4 wheels) (would benefit from rollator, pt stated he had RW but unsure if this is accurate information.)    Recommendations for Other Services       Precautions / Restrictions Precautions Precautions: Fall Precaution Comments: watch O2 Restrictions Weight Bearing Restrictions: No     Mobility  Bed Mobility Overal bed mobility: Needs Assistance Bed Mobility: Supine to Sit     Supine to sit: Modified independent (Device/Increase time) Sit to supine: Supervision, HOB  elevated        Transfers Overall transfer level: Needs assistance Equipment used: None Transfers: Sit to/from Stand Sit to Stand: Supervision                Ambulation/Gait Ambulation/Gait assistance: Min guard, Supervision Gait Distance (Feet): 480 Feet Assistive device: None Gait Pattern/deviations: Step-through pattern, Decreased stride length, Drifts right/left Gait velocity: decr Gait velocity interpretation: <1.31 ft/sec, indicative of household ambulator   General Gait Details: min drift L/R, pt endorses "staggering" which appears to be his baseline. DOE 2/4, did not desaturate below 90% on RA with acitivty.  93% on RA at rest   Stairs             Wheelchair Mobility    Modified Rankin (Stroke Patients Only)       Balance Overall balance assessment: Needs assistance Sitting-balance support: No upper extremity supported, Feet supported Sitting balance-Leahy Scale: Good     Standing balance support: No upper extremity supported Standing balance-Leahy Scale: Fair Standing balance comment: cannot withstand challenges to balance                            Cognition Arousal/Alertness: Awake/alert Behavior During Therapy: Flat affect Overall Cognitive Status: Within Functional Limits for tasks assessed Area of Impairment: Problem solving, Safety/judgement                         Safety/Judgement: Decreased awareness of safety, Decreased awareness of deficits   Problem Solving: Requires verbal cues, Requires tactile cues General Comments: slow to respond, fatigued.  Exercises      General Comments        Pertinent Vitals/Pain Pain Assessment Pain Assessment: No/denies pain    Home Living                          Prior Function            PT Goals (current goals can now be found in the care plan section) Acute Rehab PT Goals Patient Stated Goal: home Progress towards PT goals: Progressing  toward goals    Frequency    Min 3X/week      PT Plan Current plan remains appropriate    Co-evaluation              AM-PAC PT "6 Clicks" Mobility   Outcome Measure  Help needed turning from your back to your side while in a flat bed without using bedrails?: A Little Help needed moving from lying on your back to sitting on the side of a flat bed without using bedrails?: A Little Help needed moving to and from a bed to a chair (including a wheelchair)?: A Little Help needed standing up from a chair using your arms (e.g., wheelchair or bedside chair)?: A Little Help needed to walk in hospital room?: A Little Help needed climbing 3-5 steps with a railing? : A Little 6 Click Score: 18    End of Session Equipment Utilized During Treatment: Gait belt Activity Tolerance: Patient tolerated treatment well Patient left: with call bell/phone within reach;in chair Nurse Communication: Mobility status PT Visit Diagnosis: Other abnormalities of gait and mobility (R26.89);Muscle weakness (generalized) (M62.81)     Time: [id number] PT Time Calculation (min) (ACUTE ONLY): 17 min  Charges:  $Gait Training: 8-22 mins                     Crystian Frith M,PT Acute Rehab Services [place] 11/04/2022, 12:53 PM

## 2022-11-04 NOTE — Progress Notes (Signed)
SATURATION QUALIFICATIONS: (This note is used to comply with regulatory documentation for home oxygen)  Patient Saturations on Room Air at Rest = 91%  Patient Saturations on Room Air while Ambulating = 90%   Please briefly explain why patient needs home oxygen:Pt did not desat on RA at rest or with mobility therefore doesn't need home O2. Full note to follow. Has equipment at home and recommend HHPT f/u for balance training.  Raven Harmes M,PT Acute Rehab Services [phone]

## 2022-11-08 ENCOUNTER — Other Ambulatory Visit (HOSPITAL_COMMUNITY): Payer: Self-pay

## 2022-11-10 ENCOUNTER — Other Ambulatory Visit (HOSPITAL_COMMUNITY): Payer: Self-pay

## 2022-11-10 DIAGNOSIS — J449 Chronic obstructive pulmonary disease, unspecified: Secondary | ICD-10-CM | POA: Diagnosis not present

## 2022-11-10 DIAGNOSIS — F1090 Alcohol use, unspecified, uncomplicated: Secondary | ICD-10-CM | POA: Diagnosis not present

## 2022-11-10 MED ORDER — BUDESONIDE-FORMOTEROL FUMARATE 160-4.5 MCG/ACT IN AERO
2.0000 | INHALATION_SPRAY | Freq: Two times a day (BID) | RESPIRATORY_TRACT | 12 refills | Status: AC
  Filled 2022-11-10: qty 10.2, 30d supply, fill #0

## 2022-11-10 MED ORDER — BUPROPION HCL ER (XL) 150 MG PO TB24
150.0000 mg | ORAL_TABLET | Freq: Every morning | ORAL | 6 refills | Status: AC
  Filled 2022-11-10: qty 30, 30d supply, fill #0
  Filled 2022-12-14: qty 30, 30d supply, fill #1
  Filled 2023-01-17: qty 20, 20d supply, fill #2
  Filled 2023-01-17: qty 10, 10d supply, fill #2
  Filled 2023-02-28: qty 90, 90d supply, fill #3

## 2022-11-11 ENCOUNTER — Other Ambulatory Visit (HOSPITAL_COMMUNITY): Payer: Self-pay

## 2022-11-24 ENCOUNTER — Other Ambulatory Visit (HOSPITAL_COMMUNITY): Payer: Self-pay

## 2022-11-24 DIAGNOSIS — Z23 Encounter for immunization: Secondary | ICD-10-CM | POA: Diagnosis not present

## 2022-11-24 DIAGNOSIS — I10 Essential (primary) hypertension: Secondary | ICD-10-CM | POA: Diagnosis not present

## 2022-11-24 DIAGNOSIS — E782 Mixed hyperlipidemia: Secondary | ICD-10-CM | POA: Diagnosis not present

## 2022-11-24 DIAGNOSIS — Z0000 Encounter for general adult medical examination without abnormal findings: Secondary | ICD-10-CM | POA: Diagnosis not present

## 2022-11-24 MED ORDER — BISOPROLOL-HYDROCHLOROTHIAZIDE 2.5-6.25 MG PO TABS
1.0000 | ORAL_TABLET | Freq: Every morning | ORAL | 4 refills | Status: AC
  Filled 2022-11-24: qty 90, 90d supply, fill #0
  Filled 2023-02-28: qty 90, 90d supply, fill #1
  Filled 2023-06-05: qty 90, 90d supply, fill #2
  Filled 2023-09-11: qty 90, 90d supply, fill #3

## 2022-11-24 MED ORDER — ATORVASTATIN CALCIUM 20 MG PO TABS
20.0000 mg | ORAL_TABLET | Freq: Every day | ORAL | 3 refills | Status: DC
  Filled 2022-11-24: qty 90, 90d supply, fill #0
  Filled 2023-02-28: qty 90, 90d supply, fill #1
  Filled 2023-06-05: qty 90, 90d supply, fill #2
  Filled 2023-09-11: qty 90, 90d supply, fill #3

## 2022-11-24 MED ORDER — CITALOPRAM HYDROBROMIDE 20 MG PO TABS
20.0000 mg | ORAL_TABLET | Freq: Every morning | ORAL | 4 refills | Status: AC
  Filled 2022-11-24: qty 90, 90d supply, fill #0
  Filled 2023-02-28: qty 90, 90d supply, fill #1
  Filled 2023-06-05: qty 90, 90d supply, fill #2
  Filled 2023-09-11: qty 90, 90d supply, fill #3

## 2022-11-28 ENCOUNTER — Other Ambulatory Visit (HOSPITAL_COMMUNITY): Payer: Self-pay

## 2022-12-14 ENCOUNTER — Other Ambulatory Visit (HOSPITAL_COMMUNITY): Payer: Self-pay

## 2022-12-15 ENCOUNTER — Other Ambulatory Visit (HOSPITAL_COMMUNITY): Payer: Self-pay

## 2023-01-17 ENCOUNTER — Other Ambulatory Visit (HOSPITAL_COMMUNITY): Payer: Self-pay

## 2023-01-23 ENCOUNTER — Telehealth: Payer: Self-pay

## 2023-01-23 NOTE — Patient Outreach (Signed)
  Care Coordination   01/23/2023 Name: Keith Cordova MRN: [id number] DOB: Nov 25, 1955   Care Coordination Outreach Attempts:  An unsuccessful telephone outreach was attempted today to offer the patient information about available care coordination services as a benefit of their health plan.   Placed call to patients contact number for home work and mobile. Person answered and states patient does not have this number.  Attempted to contact emergency contact without success. Reviewed KPN with no additional contact numbers.   Follow Up Plan:  No further outreach attempts will be made at this time. We have been unable to contact the patient to offer or enroll patient in care coordination services  Encounter Outcome:  No Answer   Care Coordination Interventions:  No, not indicated    Tomasa Rand, RN, BSN, CEN [place] Coordinator [phone]

## 2023-02-28 ENCOUNTER — Other Ambulatory Visit (HOSPITAL_COMMUNITY): Payer: Self-pay

## 2023-04-19 DIAGNOSIS — H6121 Impacted cerumen, right ear: Secondary | ICD-10-CM | POA: Diagnosis not present

## 2023-06-05 ENCOUNTER — Other Ambulatory Visit (HOSPITAL_COMMUNITY): Payer: Self-pay

## 2023-06-05 MED ORDER — BUPROPION HCL ER (XL) 150 MG PO TB24
150.0000 mg | ORAL_TABLET | ORAL | 6 refills | Status: AC
  Filled 2023-06-05: qty 30, 30d supply, fill #0
  Filled 2023-09-11: qty 30, 30d supply, fill #1
  Filled 2023-12-14: qty 30, 30d supply, fill #2
  Filled 2024-04-03: qty 30, 30d supply, fill #3

## 2023-06-12 ENCOUNTER — Other Ambulatory Visit (HOSPITAL_COMMUNITY): Payer: Self-pay

## 2023-06-12 MED ORDER — METHYLPREDNISOLONE 4 MG PO TBPK
ORAL_TABLET | ORAL | 0 refills | Status: AC
  Filled 2023-06-12: qty 21, 6d supply, fill #0

## 2023-06-12 MED ORDER — AMOXICILLIN 500 MG PO CAPS
500.0000 mg | ORAL_CAPSULE | Freq: Three times a day (TID) | ORAL | 0 refills | Status: DC
  Filled 2023-06-12: qty 21, 7d supply, fill #0

## 2023-08-23 ENCOUNTER — Other Ambulatory Visit (HOSPITAL_COMMUNITY): Payer: Self-pay

## 2023-09-11 ENCOUNTER — Other Ambulatory Visit (HOSPITAL_COMMUNITY): Payer: Self-pay

## 2023-10-27 ENCOUNTER — Encounter (HOSPITAL_BASED_OUTPATIENT_CLINIC_OR_DEPARTMENT_OTHER): Payer: Self-pay

## 2023-10-27 ENCOUNTER — Ambulatory Visit (HOSPITAL_BASED_OUTPATIENT_CLINIC_OR_DEPARTMENT_OTHER)
Admission: EM | Admit: 2023-10-27 | Discharge: 2023-10-27 | Disposition: A | Discharge status: Home / Self Care | Payer: Medicare Other | Attending: Internal Medicine | Admitting: Internal Medicine

## 2023-10-27 ENCOUNTER — Other Ambulatory Visit (HOSPITAL_BASED_OUTPATIENT_CLINIC_OR_DEPARTMENT_OTHER): Payer: Self-pay

## 2023-10-27 DIAGNOSIS — J441 Chronic obstructive pulmonary disease with (acute) exacerbation: Secondary | ICD-10-CM

## 2023-10-27 LAB — POC COVID19/FLU A&B COMBO
Covid Antigen, POC: NEGATIVE
Influenza A Antigen, POC: NEGATIVE
Influenza B Antigen, POC: NEGATIVE

## 2023-10-27 MED ORDER — DOXYCYCLINE HYCLATE 100 MG PO CAPS
100.0000 mg | ORAL_CAPSULE | Freq: Two times a day (BID) | ORAL | 0 refills | Status: AC
  Filled 2023-10-27: qty 14, 7d supply, fill #0

## 2023-10-27 MED ORDER — BENZONATATE 100 MG PO CAPS
100.0000 mg | ORAL_CAPSULE | Freq: Three times a day (TID) | ORAL | 0 refills | Status: AC
  Filled 2023-10-27: qty 21, 7d supply, fill #0

## 2023-10-27 MED ORDER — ALBUTEROL SULFATE (2.5 MG/3ML) 0.083% IN NEBU
2.5000 mg | INHALATION_SOLUTION | Freq: Four times a day (QID) | RESPIRATORY_TRACT | 12 refills | Status: AC | PRN
  Filled 2023-10-27: qty 75, 7d supply, fill #0

## 2023-10-27 MED ORDER — IPRATROPIUM-ALBUTEROL 0.5-2.5 (3) MG/3ML IN SOLN
3.0000 mL | Freq: Once | RESPIRATORY_TRACT | Status: AC
  Administered 2023-10-27: 3 mL via RESPIRATORY_TRACT

## 2023-10-27 MED ORDER — ALBUTEROL SULFATE HFA 108 (90 BASE) MCG/ACT IN AERS
2.0000 | INHALATION_SPRAY | Freq: Four times a day (QID) | RESPIRATORY_TRACT | 0 refills | Status: AC | PRN
  Filled 2023-10-27: qty 6.7, 25d supply, fill #0

## 2023-10-27 NOTE — ED Notes (Signed)
Patient discharged with home nebulizer machine. Instruction given on set up.

## 2023-10-27 NOTE — ED Triage Notes (Signed)
Hx of COPD. Onset early part of the week with cough, congestion, and increased SOB. States had similar symptoms last year and had pnuemonia. States no nebulizer machine at home due to cost. Patient is a current every day smoker.

## 2023-10-27 NOTE — Discharge Instructions (Signed)
Your symptoms are due to COPD exacerbation.  - Use albuterol OR nebulizer machine at home inhaler every 4-6 hours as needed for cough, shortness of breath, and wheeze. - Take antibiotic as prescribed to treat potential infection to the lungs. - Take the steroid sent to the pharmacy as directed to help reduce lung inflammation and decrease the risk of another attack in the next few days. No NSAIDs like ibuprofen or aleve/naproxen while taking steroid. Take with food to avoid stomach upset. - Take prescribed cough medicine as directed.  If your symptoms do not improve in the next 2-3 days with interventions, please return. Please seek medical care for new or returning symptoms, such as difficulty breathing that doesn't improve with your medications, chest pain, voice changes, high fevers, confusion, or other new or worsening symptoms. Follow-up with PCP for ongoing management of asthma.

## 2023-10-27 NOTE — ED Provider Notes (Signed)
[facility]    CSN: [id number] Arrival date & time: 10/27/23  1023      History   Chief Complaint Chief Complaint  Patient presents with   Cough   Sore Throat   Shortness of Breath    HPI Brandol Nugen is a 67 y.o. male.   Elzia Duffany is a 67 y.o. male presenting for chief complaint of Cough, Sore Throat, and Shortness of Breath that started 5 days ago on Monday December 16th, 2024. Cough is dry and non-productive, worse at nighttime. Nasal congestion is thick and yellow/green. Sore throat is worsened by swallowing. Reports intermittent shortness of breath with cough and walking long distances. No recent sick contacts with similar symptoms. Denies CP, N/V/D, abdominal pain, rash, leg swelling, orthopnea, and dizziness. He is a current everyday cigarette smoker and has been for many years. History of multifocal pneumonia infection last year in December 2023 with acute hypoxic respiratory failure requiring hospitalization. History of COPD. Has not attempted use of any OTC medications to help with symptoms PTA.    Cough Associated symptoms: shortness of breath   Sore Throat Associated symptoms include shortness of breath.  Shortness of Breath Associated symptoms: cough     Past Medical History:  Diagnosis Date   COPD (chronic obstructive pulmonary disease) (HCC)    Depression    Hyperlipidemia    Hypertension    Sleep apnea     Patient Active Problem List   Diagnosis Date Noted   Acute respiratory failure with hypoxia (HCC) 11/02/2022   Essential hypertension, benign 02/04/2014   Other and unspecified hyperlipidemia 02/04/2014   Smoking 02/04/2014   History of depression 02/04/2014   History of COPD 02/04/2014   History of sleep apnea 02/04/2014    Past Surgical History:  Procedure Laterality Date   APPENDECTOMY     left hand surgery          Home Medications    Prior to Admission medications   Medication Sig Start Date End Date Taking?  Authorizing Provider  albuterol (PROVENTIL) (2.5 MG/3ML) 0.083% nebulizer solution Take 3 mLs (2.5 mg total) by nebulization every 6 (six) hours as needed for wheezing or shortness of breath. 10/27/23  Yes Carlisle Beers, FNP  albuterol (VENTOLIN HFA) 108 (90 Base) MCG/ACT inhaler Inhale 2 puffs into the lungs every 6 (six) hours as needed for wheezing or shortness of breath. 10/27/23  Yes Carlisle Beers, FNP  benzonatate (TESSALON) 100 MG capsule Take 1 capsule (100 mg total) by mouth every 8 (eight) hours. 10/27/23  Yes Carlisle Beers, FNP  doxycycline (VIBRAMYCIN) 100 MG capsule Take 1 capsule (100 mg total) by mouth 2 (two) times daily for 7 days. 10/27/23 11/03/23 Yes Carlisle Beers, FNP  atorvastatin (LIPITOR) 20 MG tablet Take 1 tablet (20 mg total) by mouth daily for cholesterol 10/26/21     atorvastatin (LIPITOR) 20 MG tablet Take 1 tablet (20 mg total) by mouth daily for cholesterol 11/24/22     bisoprolol-hydrochlorothiazide (ZIAC) 2.5-6.25 MG tablet Take 1 tablet by mouth in the morning for blood pressure 10/26/21     bisoprolol-hydrochlorothiazide (ZIAC) 2.5-6.25 MG tablet Take 1 tablet by mouth in the morning for blood pressure 11/24/22     budesonide-formoterol (SYMBICORT) 160-4.5 MCG/ACT inhaler Inhale 2 puffs into the lungs 2 (two) times daily. 11/10/22     buPROPion (WELLBUTRIN XL) 150 MG 24 hr tablet Take 1 tablet (150 mg total) by mouth every morning for mood 11/10/22  buPROPion (WELLBUTRIN XL) 150 MG 24 hr tablet Take 1 tablet (150 mg total) by mouth every morning for mood. 06/05/23     citalopram (CELEXA) 20 MG tablet Take 1 tablet (20 mg total) by mouth in the morning for depression/anxiety 10/26/21     citalopram (CELEXA) 20 MG tablet Take 1 tablet (20 mg total) by mouth in the morning for depression/anxiety 11/24/22     Cyanocobalamin (B-12) 1000 MCG SUBL Place 1 tablet under the tongue daily. 10/27/21     methylPREDNISolone (MEDROL) 4 MG TBPK tablet  Take as instructed 06/12/23     mupirocin ointment (BACTROBAN) 2 % Apply 1 application topically 2 (two) times daily as needed for skin infections. Patient not taking: Reported on 11/02/2022 03/28/22     QUEtiapine (SEROQUEL) 25 MG tablet Take 25 mg by mouth at bedtime as needed (for sleep).    [provider]    Family History Family History  Problem Relation Age of Onset   Heart disease Father     Social History Social History   Tobacco Use   Smoking status: Heavy Smoker    Current packs/day: 1.00    Average packs/day: 1 pack/day for 50.0 years (50.0 ttl pk-yrs)    Types: Cigarettes  Substance Use Topics   Alcohol use: Yes     Allergies   Morphine and Prednisone   Review of Systems Review of Systems  Respiratory:  Positive for cough and shortness of breath.   Per HPI   Physical Exam Triage Vital Signs ED Triage Vitals  Encounter Vitals Group     BP 10/27/23 1138 (!) 137/92     Systolic BP Percentile --      Diastolic BP Percentile --      Pulse Rate 10/27/23 1138 82     Resp 10/27/23 1138 (!) 24     Temp 10/27/23 1138 98.7 F (37.1 C)     Temp Source 10/27/23 1138 Oral     SpO2 10/27/23 1138 90 %     Weight --      Height --      Head Circumference --      Peak Flow --      Pain Score 10/27/23 1139 3     Pain Loc --      Pain Education --      Exclude from Growth Chart --    No data found.  Updated Vital Signs BP (!) 137/92 (BP Location: Right Arm)   Pulse 82   Temp 98.7 F (37.1 C) (Oral)   Resp (!) 24   SpO2 97%   Visual Acuity Right Eye Distance:   Left Eye Distance:   Bilateral Distance:    Right Eye Near:   Left Eye Near:    Bilateral Near:     Physical Exam Vitals and nursing note reviewed.  Constitutional:      Appearance: He is not ill-appearing or toxic-appearing.  HENT:     Head: Normocephalic and atraumatic.     Right Ear: Hearing, tympanic membrane, ear canal and external ear normal.     Left Ear: Hearing, tympanic  membrane, ear canal and external ear normal.     Nose: Congestion present.     Mouth/Throat:     Lips: Pink.     Mouth: Mucous membranes are moist. No injury.     Tongue: No lesions. Tongue does not deviate from midline.     Palate: No mass and lesions.     Pharynx: Oropharynx  is clear. Uvula midline. No pharyngeal swelling, oropharyngeal exudate, posterior oropharyngeal erythema or uvula swelling.     Tonsils: No tonsillar exudate or tonsillar abscesses.  Eyes:     General: Lids are normal. Vision grossly intact. Gaze aligned appropriately.     Extraocular Movements: Extraocular movements intact.     Conjunctiva/sclera: Conjunctivae normal.  Cardiovascular:     Rate and Rhythm: Normal rate and regular rhythm.     Heart sounds: Normal heart sounds, S1 normal and S2 normal.  Pulmonary:     Effort: Pulmonary effort is normal. No respiratory distress.     Breath sounds: Normal air entry. Wheezing and rhonchi present. No rales.     Comments: Decreased breath sounds throughout, faint expiratory wheeze and course rhonchi heard to bilateral lower lung fields. Speaking in full sentences without difficulty/increased respiratory effort.  Chest:     Chest wall: No tenderness.  Musculoskeletal:     Cervical back: Neck supple.  Skin:    General: Skin is warm and dry.     Capillary Refill: Capillary refill takes less than 2 seconds.     Findings: No rash.  Neurological:     General: No focal deficit present.     Mental Status: He is alert and oriented to person, place, and time. Mental status is at baseline.     Cranial Nerves: No dysarthria or facial asymmetry.  Psychiatric:        Mood and Affect: Mood normal.        Speech: Speech normal.        Behavior: Behavior normal.        Thought Content: Thought content normal.        Judgment: Judgment normal.      UC Treatments / Results  Labs (all labs ordered are listed, but only abnormal results are displayed) Labs Reviewed  POC  COVID19/FLU A&B COMBO    EKG   Radiology No results found.  Procedures Procedures (including critical care time)  Medications Ordered in UC Medications  ipratropium-albuterol (DUONEB) 0.5-2.5 (3) MG/3ML nebulizer solution 3 mL (3 mLs Nebulization Given 10/27/23 1159)    Initial Impression / Assessment and Plan / UC Course  I have reviewed the triage vital signs and the nursing notes.  Pertinent labs & imaging results that were available during my care of the patient were reviewed by me and considered in my medical decision making (see chart for details).   1. COPD exacerbation Presentation is consistent with acute COPD exacerbation.  Patient non-toxic in appearance, vital signs hemodynamically stable, no new oxygen requirement, therefore deferred imaging of the chest. Symptoms and lung sounds improved after duo neb in clinic.  Oxygen initially 90% on room air, 97% after breathing treatment.  Will treat with antibiotic (doxycycline), bronchodilator, cough suppressants for symptomatic relief, and expectorants (mucinex) as needed.   Reports he does not want prednisone/steroid as this "made him crazy" the last  time he had it and caused panic attacks. Offered symbicort, he declines this as well.  Strep/Viral testing: POC COVID and flu are negative.  Home nebulizer machine dispensed for breathing treatments at home as needed. Albuterol nebulizer solution sent to pharmacy.  PCP follow-up encouraged.  Counseled patient on potential for adverse effects with medications prescribed/recommended today, strict ER and return-to-clinic precautions discussed, patient verbalized understanding.    Final Clinical Impressions(s) / UC Diagnoses   Final diagnoses:  COPD exacerbation [facility])     Discharge Instructions      Your symptoms are  due to COPD exacerbation.  - Use albuterol OR nebulizer machine at home inhaler every 4-6 hours as needed for cough, shortness of breath, and  wheeze. - Take antibiotic as prescribed to treat potential infection to the lungs. - Take the steroid sent to the pharmacy as directed to help reduce lung inflammation and decrease the risk of another attack in the next few days. No NSAIDs like ibuprofen or aleve/naproxen while taking steroid. Take with food to avoid stomach upset. - Take prescribed cough medicine as directed.  If your symptoms do not improve in the next 2-3 days with interventions, please return. Please seek medical care for new or returning symptoms, such as difficulty breathing that doesn't improve with your medications, chest pain, voice changes, high fevers, confusion, or other new or worsening symptoms. Follow-up with PCP for ongoing management of asthma.     ED Prescriptions     Medication Sig Dispense Auth. Provider   doxycycline (VIBRAMYCIN) 100 MG capsule Take 1 capsule (100 mg total) by mouth 2 (two) times daily for 7 days. 14 capsule Reita May M, FNP   albuterol (PROVENTIL) (2.5 MG/3ML) 0.083% nebulizer solution Take 3 mLs (2.5 mg total) by nebulization every 6 (six) hours as needed for wheezing or shortness of breath. 75 mL Reita May M, FNP   albuterol (VENTOLIN HFA) 108 (90 Base) MCG/ACT inhaler Inhale 2 puffs into the lungs every 6 (six) hours as needed for wheezing or shortness of breath. 6.7 g Reita May M, FNP   benzonatate (TESSALON) 100 MG capsule Take 1 capsule (100 mg total) by mouth every 8 (eight) hours. 21 capsule Carlisle Beers, FNP      PDMP not reviewed this encounter.   Carlisle Beers, [place] 10/27/23 1239

## 2023-12-14 ENCOUNTER — Other Ambulatory Visit (HOSPITAL_BASED_OUTPATIENT_CLINIC_OR_DEPARTMENT_OTHER): Payer: Self-pay

## 2023-12-15 ENCOUNTER — Other Ambulatory Visit (HOSPITAL_BASED_OUTPATIENT_CLINIC_OR_DEPARTMENT_OTHER): Payer: Self-pay

## 2023-12-18 ENCOUNTER — Other Ambulatory Visit (HOSPITAL_BASED_OUTPATIENT_CLINIC_OR_DEPARTMENT_OTHER): Payer: Self-pay

## 2023-12-19 ENCOUNTER — Other Ambulatory Visit (HOSPITAL_BASED_OUTPATIENT_CLINIC_OR_DEPARTMENT_OTHER): Payer: Self-pay

## 2023-12-20 ENCOUNTER — Other Ambulatory Visit (HOSPITAL_BASED_OUTPATIENT_CLINIC_OR_DEPARTMENT_OTHER): Payer: Self-pay

## 2023-12-22 ENCOUNTER — Other Ambulatory Visit (HOSPITAL_BASED_OUTPATIENT_CLINIC_OR_DEPARTMENT_OTHER): Payer: Self-pay

## 2023-12-25 ENCOUNTER — Other Ambulatory Visit (HOSPITAL_BASED_OUTPATIENT_CLINIC_OR_DEPARTMENT_OTHER): Payer: Self-pay

## 2023-12-25 DIAGNOSIS — I10 Essential (primary) hypertension: Secondary | ICD-10-CM | POA: Diagnosis not present

## 2023-12-25 DIAGNOSIS — F331 Major depressive disorder, recurrent, moderate: Secondary | ICD-10-CM | POA: Diagnosis not present

## 2023-12-25 DIAGNOSIS — F410 Panic disorder [episodic paroxysmal anxiety] without agoraphobia: Secondary | ICD-10-CM | POA: Diagnosis not present

## 2023-12-25 DIAGNOSIS — Z1331 Encounter for screening for depression: Secondary | ICD-10-CM | POA: Diagnosis not present

## 2023-12-25 DIAGNOSIS — Z9181 History of falling: Secondary | ICD-10-CM | POA: Diagnosis not present

## 2023-12-25 DIAGNOSIS — Z1339 Encounter for screening examination for other mental health and behavioral disorders: Secondary | ICD-10-CM | POA: Diagnosis not present

## 2023-12-25 DIAGNOSIS — E538 Deficiency of other specified B group vitamins: Secondary | ICD-10-CM | POA: Diagnosis not present

## 2023-12-25 DIAGNOSIS — Z0000 Encounter for general adult medical examination without abnormal findings: Secondary | ICD-10-CM | POA: Diagnosis not present

## 2023-12-25 DIAGNOSIS — E782 Mixed hyperlipidemia: Secondary | ICD-10-CM | POA: Diagnosis not present

## 2023-12-25 DIAGNOSIS — Z79899 Other long term (current) drug therapy: Secondary | ICD-10-CM | POA: Diagnosis not present

## 2023-12-25 DIAGNOSIS — J449 Chronic obstructive pulmonary disease, unspecified: Secondary | ICD-10-CM | POA: Diagnosis not present

## 2023-12-25 MED ORDER — BUDESONIDE-FORMOTEROL FUMARATE 160-4.5 MCG/ACT IN AERO
2.0000 | INHALATION_SPRAY | Freq: Two times a day (BID) | RESPIRATORY_TRACT | 12 refills | Status: AC
  Filled 2023-12-25: qty 10.2, 30d supply, fill #0
  Filled 2024-04-03 – 2024-04-04 (×2): qty 10.3, 31d supply, fill #1

## 2023-12-25 MED ORDER — ATORVASTATIN CALCIUM 20 MG PO TABS
20.0000 mg | ORAL_TABLET | Freq: Every day | ORAL | 3 refills | Status: AC
  Filled 2023-12-25: qty 90, 90d supply, fill #0
  Filled 2024-04-03: qty 90, 90d supply, fill #1
  Filled 2024-07-16: qty 90, 90d supply, fill #2
  Filled 2024-10-21: qty 90, 90d supply, fill #3

## 2023-12-25 MED ORDER — LORAZEPAM 0.5 MG PO TABS
0.5000 mg | ORAL_TABLET | Freq: Every day | ORAL | 0 refills | Status: AC | PRN
  Filled 2023-12-25: qty 30, 30d supply, fill #0

## 2023-12-25 MED ORDER — CITALOPRAM HYDROBROMIDE 20 MG PO TABS
20.0000 mg | ORAL_TABLET | Freq: Every morning | ORAL | 4 refills | Status: AC
  Filled 2023-12-25: qty 90, 90d supply, fill #0
  Filled 2024-04-03: qty 90, 90d supply, fill #1
  Filled 2024-07-16: qty 90, 90d supply, fill #2
  Filled 2024-10-21: qty 90, 90d supply, fill #3

## 2023-12-25 MED ORDER — BUPROPION HCL ER (XL) 150 MG PO TB24
150.0000 mg | ORAL_TABLET | Freq: Every morning | ORAL | 3 refills | Status: AC
  Filled 2023-12-25: qty 90, 90d supply, fill #0

## 2023-12-25 MED ORDER — BISOPROLOL-HYDROCHLOROTHIAZIDE 2.5-6.25 MG PO TABS
1.0000 | ORAL_TABLET | Freq: Every morning | ORAL | 4 refills | Status: AC
  Filled 2023-12-25: qty 90, 90d supply, fill #0
  Filled 2024-04-03: qty 90, 90d supply, fill #1
  Filled 2024-07-16: qty 90, 90d supply, fill #2
  Filled 2024-10-21: qty 90, 90d supply, fill #3

## 2023-12-26 ENCOUNTER — Other Ambulatory Visit (HOSPITAL_BASED_OUTPATIENT_CLINIC_OR_DEPARTMENT_OTHER): Payer: Self-pay

## 2023-12-29 ENCOUNTER — Emergency Department (HOSPITAL_COMMUNITY)
Admission: EM | Admit: 2023-12-29 | Discharge: 2023-12-29 | Disposition: A | Discharge status: Home / Self Care | Payer: HMO | Attending: Emergency Medicine | Admitting: Emergency Medicine

## 2023-12-29 ENCOUNTER — Other Ambulatory Visit: Payer: Self-pay

## 2023-12-29 ENCOUNTER — Other Ambulatory Visit (HOSPITAL_BASED_OUTPATIENT_CLINIC_OR_DEPARTMENT_OTHER): Payer: Self-pay

## 2023-12-29 ENCOUNTER — Encounter (HOSPITAL_COMMUNITY): Payer: Self-pay

## 2023-12-29 DIAGNOSIS — F419 Anxiety disorder, unspecified: Secondary | ICD-10-CM | POA: Insufficient documentation

## 2023-12-29 DIAGNOSIS — Z79899 Other long term (current) drug therapy: Secondary | ICD-10-CM | POA: Insufficient documentation

## 2023-12-29 DIAGNOSIS — Z7982 Long term (current) use of aspirin: Secondary | ICD-10-CM | POA: Diagnosis not present

## 2023-12-29 LAB — SALICYLATE LEVEL: Salicylate Lvl: <7 mg/dL — ABNORMAL LOW (ref 7.0–30.0)

## 2023-12-29 LAB — CBC
HCT: 44.5 % (ref 39.0–52.0)
Hemoglobin: 14.9 g/dL (ref 13.0–17.0)
MCH: 33 pg (ref 26.0–34.0)
MCHC: 33.5 g/dL (ref 30.0–36.0)
MCV: 98.5 fL (ref 80.0–100.0)
Platelets: 258 10*3/uL (ref 150–400)
RBC: 4.52 MIL/uL (ref 4.22–5.81)
RDW: 13 % (ref 11.5–15.5)
WBC: 6.4 10*3/uL (ref 4.0–10.5)
nRBC: 0 % (ref 0.0–0.2)

## 2023-12-29 LAB — COMPREHENSIVE METABOLIC PANEL
ALT: 31 U/L (ref 0–44)
AST: 23 U/L (ref 15–41)
Albumin: 4 g/dL (ref 3.5–5.0)
Alkaline Phosphatase: 58 U/L (ref 38–126)
Anion gap: 8 (ref 5–15)
BUN: 13 mg/dL (ref 8–23)
CO2: 27 mmol/L (ref 22–32)
Calcium: 9.2 mg/dL (ref 8.9–10.3)
Chloride: 103 mmol/L (ref 98–111)
Creatinine, Ser: 0.82 mg/dL (ref 0.61–1.24)
GFR, Estimated: >60 mL/min (ref >60)
Glucose, Bld: 108 mg/dL — ABNORMAL HIGH (ref 70–99)
Potassium: 4 mmol/L (ref 3.5–5.1)
Sodium: 138 mmol/L (ref 135–145)
Total Bilirubin: 0.8 mg/dL (ref 0.0–1.2)
Total Protein: 7.1 g/dL (ref 6.5–8.1)

## 2023-12-29 LAB — RAPID URINE DRUG SCREEN, HOSP PERFORMED
Amphetamines: NOT DETECTED
Barbiturates: NOT DETECTED
Benzodiazepines: NOT DETECTED
Cocaine: NOT DETECTED
Opiates: NOT DETECTED
Tetrahydrocannabinol: NOT DETECTED

## 2023-12-29 LAB — ETHANOL: Alcohol, Ethyl (B): <10 mg/dL (ref <10)

## 2023-12-29 LAB — ACETAMINOPHEN LEVEL: Acetaminophen (Tylenol), Serum: <10 ug/mL — ABNORMAL LOW (ref 10–30)

## 2023-12-29 MED ORDER — HYDROXYZINE HCL 25 MG PO TABS
25.0000 mg | ORAL_TABLET | Freq: Three times a day (TID) | ORAL | 0 refills | Status: DC | PRN
  Filled 2023-12-29: qty 12, 4d supply, fill #0

## 2023-12-29 MED ORDER — HYDROXYZINE HCL 25 MG PO TABS: 25.0000 mg | ORAL_TABLET | Freq: Three times a day (TID) | ORAL | 0 refills | Status: AC | PRN

## 2023-12-29 NOTE — Discharge Instructions (Addendum)
Your labs here came back okay.  Call your doctor on Monday to schedule a follow-up visit.

## 2023-12-29 NOTE — ED Provider Notes (Signed)
EMERGENCY DEPARTMENT AT [facility] Provider Note   CSN: [id number] Arrival date & time: 12/29/23  1437     History  Chief Complaint  Patient presents with   Anxiety    Keith Cordova is a 68 y.o. male.  68 year old male with history anxiety presents due to worsening symptoms.  He denies any SI or HI.  States that he saw his doctor on Monday and have his prescription refilled.  Notes have been out of his prescriptions for about 5 days or so.  States he is afraid that he might become more anxious and wants medications for this.  Denies any factors driving this.  No recent use of alcohol or illicit drugs       Home Medications Prior to Admission medications   Medication Sig Start Date End Date Taking? Authorizing Provider  aspirin EC 81 MG tablet Take 162 mg by mouth daily as needed for mild pain (pain score 1-3). Swallow whole.   Yes [provider]  Ibuprofen 200 MG CAPS Take 400 mg by mouth every 6 (six) hours as needed (for pain or headaches).   Yes [provider]  albuterol (PROVENTIL) (2.5 MG/3ML) 0.083% nebulizer solution Take 3 mLs (2.5 mg total) by nebulization every 6 (six) hours as needed for wheezing or shortness of breath. 10/27/23  Yes Carlisle Beers, FNP  albuterol (VENTOLIN HFA) 108 (90 Base) MCG/ACT inhaler Inhale 2 puffs into the lungs every 6 (six) hours as needed for wheezing or shortness of breath. 10/27/23  Yes Carlisle Beers, FNP  atorvastatin (LIPITOR) 20 MG tablet Take 1 tablet (20 mg total) by mouth daily for cholesterol Patient not taking: Reported on 12/29/2023 10/26/21     atorvastatin (LIPITOR) 20 MG tablet Take 1 tablet (20 mg total) by mouth daily for cholesterol. 12/25/23  Yes   benzonatate (TESSALON) 100 MG capsule Take 1 capsule (100 mg total) by mouth every 8 (eight) hours. Patient not taking: Reported on 12/29/2023 10/27/23   Carlisle Beers, FNP  bisoprolol-hydrochlorothiazide [facility])  2.5-6.25 MG tablet Take 1 tablet by mouth in the morning for blood pressure Patient not taking: Reported on 12/29/2023 10/26/21     bisoprolol-hydrochlorothiazide (ZIAC) 2.5-6.25 MG tablet Take 1 tablet by mouth in the morning for blood pressure Patient not taking: Reported on 12/29/2023 11/24/22     bisoprolol-hydrochlorothiazide (ZIAC) 2.5-6.25 MG tablet Take 1 tablet by mouth in the morning for blood pressure. 12/25/23  Yes   budesonide-formoterol (SYMBICORT) 160-4.5 MCG/ACT inhaler Inhale 2 puffs into the lungs 2 (two) times daily. Patient not taking: Reported on 12/29/2023 11/10/22     budesonide-formoterol (SYMBICORT) 160-4.5 MCG/ACT inhaler Inhale 2 puffs into the lungs 2 (two) times daily. 12/25/23  Yes   buPROPion (WELLBUTRIN XL) 150 MG 24 hr tablet Take 1 tablet (150 mg total) by mouth every morning for mood Patient not taking: Reported on 12/29/2023 11/10/22     buPROPion (WELLBUTRIN XL) 150 MG 24 hr tablet Take 1 tablet (150 mg total) by mouth every morning for mood. Patient not taking: Reported on 12/29/2023 06/05/23     buPROPion (WELLBUTRIN XL) 150 MG 24 hr tablet Take 1 tablet (150 mg total) by mouth in the morning for mood. 12/25/23  Yes   citalopram (CELEXA) 20 MG tablet Take 1 tablet (20 mg total) by mouth in the morning for depression/anxiety Patient not taking: Reported on 12/29/2023 10/26/21     citalopram (CELEXA) 20 MG tablet Take 1 tablet (20 mg total) by  mouth in the morning for depression/anxiety Patient not taking: Reported on 12/29/2023 11/24/22     citalopram (CELEXA) 20 MG tablet Take 1 tablet (20 mg total) by mouth in the morning for depression/anxiety. 12/25/23  Yes   Cyanocobalamin (B-12) 1000 MCG SUBL Place 1 tablet under the tongue daily. 10/27/21     LORazepam (ATIVAN) 0.5 MG tablet Take 1 tablet (0.5 mg total) by mouth daily as needed for panic attack. Patient taking differently: Take 0.5 mg by mouth 2 (two) times daily as needed for anxiety. 12/25/23  Yes   methylPREDNISolone  (MEDROL) 4 MG TBPK tablet Take as instructed Patient not taking: Reported on 12/29/2023 06/12/23     mupirocin ointment (BACTROBAN) 2 % Apply 1 application topically 2 (two) times daily as needed for skin infections. Patient not taking: Reported on 12/29/2023 03/28/22         Allergies    Cortisone and Morphine    Review of Systems   Review of Systems  All other systems reviewed and are negative.   Physical Exam Updated Vital Signs BP (!) 162/99 (BP Location: Right Arm)   Pulse 72   Temp 98.2 F (36.8 C) (Oral)   Resp 16   Ht 1.778 m (5\' 10" )   Wt 110 kg   SpO2 98%   BMI 34.80 kg/m  Physical Exam Vitals and nursing note reviewed.  Constitutional:      General: He is not in acute distress.    Appearance: Normal appearance. He is well-developed. He is not toxic-appearing.  HENT:     Head: Normocephalic and atraumatic.  Eyes:     General: Lids are normal.     Conjunctiva/sclera: Conjunctivae normal.     Pupils: Pupils are equal, round, and reactive to light.  Neck:     Thyroid: No thyroid mass.     Trachea: No tracheal deviation.  Cardiovascular:     Rate and Rhythm: Normal rate and regular rhythm.     Heart sounds: Normal heart sounds. No murmur heard.    No gallop.  Pulmonary:     Effort: Pulmonary effort is normal. No respiratory distress.     Breath sounds: Normal breath sounds. No stridor. No decreased breath sounds, wheezing, rhonchi or rales.  Abdominal:     General: There is no distension.     Palpations: Abdomen is soft.     Tenderness: There is no abdominal tenderness. There is no rebound.  Musculoskeletal:        General: No tenderness. Normal range of motion.     Cervical back: Normal range of motion and neck supple.  Skin:    General: Skin is warm and dry.     Findings: No abrasion or rash.  Neurological:     Mental Status: He is alert and oriented to person, place, and time. Mental status is at baseline.     GCS: GCS eye subscore is 4. GCS verbal  subscore is 5. GCS motor subscore is 6.     Cranial Nerves: No cranial nerve deficit.     Sensory: No sensory deficit.     Motor: Motor function is intact.  Psychiatric:        Attention and Perception: Attention normal.        Mood and Affect: Mood normal.        Speech: Speech normal.        Behavior: Behavior normal.        Thought Content: Thought content does not include homicidal or  suicidal ideation.     ED Results / Procedures / Treatments   Labs (all labs ordered are listed, but only abnormal results are displayed) Labs Reviewed  COMPREHENSIVE METABOLIC PANEL - Abnormal; Notable for the following components:      Result Value   Glucose, Bld 108 (*)    All other components within normal limits  SALICYLATE LEVEL - Abnormal; Notable for the following components:   Salicylate Lvl <7.0 (*)    All other components within normal limits  ACETAMINOPHEN LEVEL - Abnormal; Notable for the following components:   Acetaminophen (Tylenol), Serum <10 (*)    All other components within normal limits  ETHANOL  CBC  RAPID URINE DRUG SCREEN, HOSP PERFORMED    EKG None  Radiology No results found.  Procedures Procedures    Medications Ordered in ED Medications - No data to display  ED Course/ Medical Decision Making/ A&P                                 Medical Decision Making Amount and/or Complexity of Data Reviewed Labs: ordered.   Patient's labs are overall reassuring.  He has no acute psychiatric illness at this point.  Patient has restarted his home medications a few days ago some of which take a while to have an effect.  Will prescribe patient as needed Ativan to take as needed.  He will follow-up with his doctor next week        Final Clinical Impression(s) / ED Diagnoses Final diagnoses:  None    Rx / DC Orders ED Discharge Orders     None         Lorre Nick, MD 12/29/23 1621

## 2023-12-29 NOTE — ED Triage Notes (Addendum)
Pt arrived via POV C/o anxiety attacks that get worse at night. Pt states that when they are bad they wish they wouldn't wake up.  Pt was out of meds for a few days, but got a refill. States they are not under care of a psychiatrist.

## 2024-01-05 ENCOUNTER — Other Ambulatory Visit (HOSPITAL_BASED_OUTPATIENT_CLINIC_OR_DEPARTMENT_OTHER): Payer: Self-pay

## 2024-04-03 ENCOUNTER — Other Ambulatory Visit (HOSPITAL_BASED_OUTPATIENT_CLINIC_OR_DEPARTMENT_OTHER): Payer: Self-pay

## 2024-04-04 ENCOUNTER — Other Ambulatory Visit (HOSPITAL_BASED_OUTPATIENT_CLINIC_OR_DEPARTMENT_OTHER): Payer: Self-pay

## 2024-04-05 ENCOUNTER — Other Ambulatory Visit (HOSPITAL_BASED_OUTPATIENT_CLINIC_OR_DEPARTMENT_OTHER): Payer: Self-pay

## 2024-04-17 ENCOUNTER — Other Ambulatory Visit (HOSPITAL_BASED_OUTPATIENT_CLINIC_OR_DEPARTMENT_OTHER): Payer: Self-pay

## 2024-07-16 ENCOUNTER — Other Ambulatory Visit (HOSPITAL_BASED_OUTPATIENT_CLINIC_OR_DEPARTMENT_OTHER): Payer: Self-pay

## 2024-07-16 ENCOUNTER — Other Ambulatory Visit (HOSPITAL_COMMUNITY): Payer: Self-pay

## 2024-07-23 ENCOUNTER — Other Ambulatory Visit (HOSPITAL_BASED_OUTPATIENT_CLINIC_OR_DEPARTMENT_OTHER): Payer: Self-pay

## 2024-08-21 DIAGNOSIS — Z23 Encounter for immunization: Secondary | ICD-10-CM | POA: Diagnosis not present

## 2024-08-21 DIAGNOSIS — S46911A Strain of unspecified muscle, fascia and tendon at shoulder and upper arm level, right arm, initial encounter: Secondary | ICD-10-CM | POA: Diagnosis not present

## 2024-08-26 ENCOUNTER — Other Ambulatory Visit (HOSPITAL_BASED_OUTPATIENT_CLINIC_OR_DEPARTMENT_OTHER): Payer: Self-pay

## 2024-08-26 DIAGNOSIS — L720 Epidermal cyst: Secondary | ICD-10-CM | POA: Diagnosis not present

## 2024-08-26 MED ORDER — TRIAMCINOLONE ACETONIDE 0.1 % EX LOTN
1.0000 | TOPICAL_LOTION | Freq: Two times a day (BID) | CUTANEOUS | 1 refills | Status: AC
  Filled 2024-08-26: qty 60, 30d supply, fill #0

## 2024-09-04 ENCOUNTER — Other Ambulatory Visit (HOSPITAL_COMMUNITY): Payer: Self-pay

## 2024-09-04 ENCOUNTER — Other Ambulatory Visit (HOSPITAL_BASED_OUTPATIENT_CLINIC_OR_DEPARTMENT_OTHER): Payer: Self-pay

## 2024-09-04 ENCOUNTER — Other Ambulatory Visit: Payer: Self-pay

## 2024-09-04 MED ORDER — TRIAMCINOLONE ACETONIDE 0.1 % EX LOTN
1.0000 | TOPICAL_LOTION | Freq: Two times a day (BID) | CUTANEOUS | 1 refills | Status: AC
  Filled 2024-09-04: qty 60, 30d supply, fill #0

## 2024-09-04 MED ORDER — DOXYCYCLINE HYCLATE 100 MG PO CAPS
100.0000 mg | ORAL_CAPSULE | Freq: Two times a day (BID) | ORAL | 0 refills | Status: AC
  Filled 2024-09-04: qty 14, 7d supply, fill #0

## 2024-10-21 ENCOUNTER — Other Ambulatory Visit (HOSPITAL_BASED_OUTPATIENT_CLINIC_OR_DEPARTMENT_OTHER): Payer: Self-pay

## 2024-11-11 ENCOUNTER — Other Ambulatory Visit (HOSPITAL_BASED_OUTPATIENT_CLINIC_OR_DEPARTMENT_OTHER): Payer: Self-pay

## 2024-11-11 MED ORDER — LATANOPROST 0.005 % OP SOLN
1.0000 [drp] | Freq: Every day | OPHTHALMIC | 11 refills | Status: AC
  Filled 2024-11-11: qty 2.5, 25d supply, fill #0
